# Patient Record
Sex: Female | Born: 1975 | Race: White | Hispanic: No | Marital: Single | Smoking: Current every day smoker
Health system: Southern US, Community
[De-identification: ages and names within clinical notes are randomized; demographics above are authoritative.]

## PROBLEM LIST (undated history)

## (undated) DIAGNOSIS — D649 Anemia, unspecified: Secondary | ICD-10-CM

## (undated) DIAGNOSIS — K5792 Diverticulitis of intestine, part unspecified, without perforation or abscess without bleeding: Secondary | ICD-10-CM

## (undated) HISTORY — PX: CHOLECYSTECTOMY: SHX55

---

## 2002-10-27 ENCOUNTER — Encounter (HOSPITAL_BASED_OUTPATIENT_CLINIC_OR_DEPARTMENT_OTHER): Payer: Self-pay | Admitting: Family

## 2005-11-27 DIAGNOSIS — K5792 Diverticulitis of intestine, part unspecified, without perforation or abscess without bleeding: Secondary | ICD-10-CM

## 2005-11-27 HISTORY — DX: Diverticulitis of intestine, part unspecified, without perforation or abscess without bleeding: K57.92

## 2011-09-18 HISTORY — PX: BACK SURGERY: SHX140

## 2012-09-10 ENCOUNTER — Emergency Department (HOSPITAL_COMMUNITY)
Admission: EM | Admit: 2012-09-10 | Discharge: 2012-09-10 | Disposition: A | Discharge status: Home / Self Care | Payer: Self-pay | Attending: Emergency Medicine | Admitting: Emergency Medicine

## 2012-09-10 ENCOUNTER — Encounter (HOSPITAL_COMMUNITY): Payer: Self-pay | Admitting: Emergency Medicine

## 2012-09-10 ENCOUNTER — Emergency Department (HOSPITAL_COMMUNITY): Payer: Self-pay

## 2012-09-10 DIAGNOSIS — K5792 Diverticulitis of intestine, part unspecified, without perforation or abscess without bleeding: Secondary | ICD-10-CM

## 2012-09-10 DIAGNOSIS — K5732 Diverticulitis of large intestine without perforation or abscess without bleeding: Secondary | ICD-10-CM | POA: Insufficient documentation

## 2012-09-10 DIAGNOSIS — R1032 Left lower quadrant pain: Secondary | ICD-10-CM | POA: Insufficient documentation

## 2012-09-10 HISTORY — DX: Diverticulitis of intestine, part unspecified, without perforation or abscess without bleeding: K57.92

## 2012-09-10 LAB — CBC WITH DIFFERENTIAL/PLATELET
Basophils Absolute: 0 10*3/uL (ref 0.0–0.1)
Basophils Relative: 1 % (ref 0–1)
Eosinophils Absolute: 0.3 10*3/uL (ref 0.0–0.7)
Lymphs Abs: 2.5 10*3/uL (ref 0.7–4.0)
MCH: 28.4 pg (ref 26.0–34.0)
MCHC: 33.4 g/dL (ref 30.0–36.0)
Neutrophils Relative %: 61 % (ref 43–77)
Platelets: 195 10*3/uL (ref 150–400)
RBC: 4.29 MIL/uL (ref 3.87–5.11)
RDW: 12.5 % (ref 11.5–15.5)

## 2012-09-10 LAB — URINALYSIS, ROUTINE W REFLEX MICROSCOPIC
Bilirubin Urine: NEGATIVE
Nitrite: NEGATIVE
Protein, ur: NEGATIVE mg/dL
Specific Gravity, Urine: 1.011 (ref 1.005–1.030)
Urobilinogen, UA: 0.2 mg/dL (ref 0.0–1.0)

## 2012-09-10 LAB — BASIC METABOLIC PANEL
Calcium: 8.9 mg/dL (ref 8.4–10.5)
GFR calc Af Amer: >90 mL/min (ref >90)
GFR calc non Af Amer: >90 mL/min (ref >90)
Potassium: 4.1 mEq/L (ref 3.5–5.1)
Sodium: 139 mEq/L (ref 135–145)

## 2012-09-10 LAB — PREGNANCY, URINE: Preg Test, Ur: NEGATIVE

## 2012-09-10 MED ORDER — OXYCODONE-ACETAMINOPHEN 5-325 MG PO TABS: 1.0000 | ORAL_TABLET | Freq: Four times a day (QID) | ORAL | Status: DC | PRN

## 2012-09-10 MED ORDER — IOHEXOL 300 MG/ML  SOLN
100.0000 mL | Freq: Once | INTRAMUSCULAR | Status: AC | PRN
  Administered 2012-09-10: 100 mL via INTRAVENOUS

## 2012-09-10 MED ORDER — METRONIDAZOLE 500 MG PO TABS: 500.0000 mg | ORAL_TABLET | Freq: Two times a day (BID) | ORAL | Status: DC

## 2012-09-10 MED ORDER — SODIUM CHLORIDE 0.9 % IV BOLUS (SEPSIS)
1000.0000 mL | Freq: Once | INTRAVENOUS | Status: AC
  Administered 2012-09-10: 1000 mL via INTRAVENOUS

## 2012-09-10 MED ORDER — FENTANYL CITRATE 0.05 MG/ML IJ SOLN
50.0000 ug | Freq: Once | INTRAMUSCULAR | Status: AC
  Administered 2012-09-10: 50 ug via INTRAVENOUS
  Filled 2012-09-10: qty 2

## 2012-09-10 MED ORDER — CIPROFLOXACIN HCL 500 MG PO TABS: 500.0000 mg | ORAL_TABLET | Freq: Two times a day (BID) | ORAL | Status: DC

## 2012-09-10 MED ORDER — ONDANSETRON HCL 4 MG PO TABS: 4.0000 mg | ORAL_TABLET | Freq: Four times a day (QID) | ORAL | Status: DC

## 2012-09-10 MED ORDER — ONDANSETRON HCL 4 MG/2ML IJ SOLN
4.0000 mg | Freq: Once | INTRAMUSCULAR | Status: AC
  Administered 2012-09-10: 4 mg via INTRAVENOUS
  Filled 2012-09-10: qty 2

## 2012-09-10 NOTE — ED Provider Notes (Signed)
History     CSN: [id number]  Arrival date & time 09/10/12  1053   First MD Initiated Contact with Patient 09/10/12 1113      Chief Complaint  Patient presents with  . Abdominal Pain    (Consider location/radiation/quality/duration/timing/severity/associated sxs/prior treatment) HPI  Patient presents to the emergency department with 3 days of left lower quadrant abdominal pain that has been persistently getting worse. She says that she has had associated hot flashes but has not taken her temperature. She does endorse having urinary frequency when she coughs but denies any dysuria or hematuria. She states that she just finished her period and is not having any abnormal vaginal discharge. Her pain gets worse she takes large breath or bends over. Her last regular bowel movement was this morning. She has had only one episode of vomiting which was this morning after walking a long distance but has not had any diarrhea. She states that this happened once in 2007 and she was diagnosed with "some sort of inflammation in her colon". NAD/ VSS Past Medical History  Diagnosis Date  . Diverticulitis 2007    Past Surgical History  Procedure Date  . Cholecystectomy   . Cesarean section 2008    History reviewed. No pertinent family history.  History  Substance Use Topics  . Smoking status: Current Every Day Smoker -- 0.5 packs/day    Types: Cigarettes  . Smokeless tobacco: Not on file  . Alcohol Use: Yes     rarely    OB History    Grav Para Term Preterm Abortions TAB SAB Ect Mult Living                  Review of Systems  Review of Systems  Gen: no weight loss, fevers, chills, night sweats  Eyes: no discharge or drainage, no occular pain or visual changes  Nose: no epistaxis or rhinorrhea  Mouth: no dental pain, no sore throat  Neck: no neck pain  Lungs:No wheezing, coughing or hemoptysis CV: no chest pain, palpitations, dependent edema or orthopnea  Abd: + abdominal pain,  nausea, and 1 episode of vomiting  GU: no dysuria or gross hematuria  MSK:  No abnormalities  Neuro: no headache, no focal neurologic deficits  Skin: no abnormalities Psyche: negative.    Allergies  Review of patient's allergies indicates no known allergies.  Home Medications   Current Outpatient Rx  Name Route Sig Dispense Refill  . ACETAMINOPHEN 325 MG PO TABS Oral Take 650 mg by mouth every 6 (six) hours as needed. pain    . IBUPROFEN 200 MG PO TABS Oral Take 600 mg by mouth every 6 (six) hours as needed. pain      BP 104/55  Pulse 64  Temp 97.9 F (36.6 C) (Oral)  Resp 16  Ht 5\' 7"  (1.702 m)  Wt 180 lb (81.647 kg)  BMI 28.19 kg/m2  SpO2 100%  LMP 09/04/2012  Physical Exam  Nursing note and vitals reviewed. Constitutional: She appears well-developed and well-nourished. No distress.  HENT:  Head: Normocephalic and atraumatic.  Eyes: Pupils are equal, round, and reactive to light.  Neck: Normal range of motion. Neck supple.  Cardiovascular: Normal rate and regular rhythm.   Pulmonary/Chest: Effort normal.  Abdominal: Soft. She exhibits no distension and no mass. There is tenderness (LLQ). There is no rebound and no guarding.  Neurological: She is alert.  Skin: Skin is warm and dry.    ED Course  Procedures (including critical care time)  Labs Reviewed  PREGNANCY, URINE  URINALYSIS, ROUTINE W REFLEX MICROSCOPIC  CBC WITH DIFFERENTIAL  BASIC METABOLIC PANEL   Ct Abdomen Pelvis W Contrast  09/10/2012  *RADIOLOGY REPORT*  Clinical Data: Left lower quadrant pain for 3 days.  Enhancement fever.  CT ABDOMEN AND PELVIS WITH CONTRAST  Technique:  Multidetector CT imaging of the abdomen and pelvis was performed following the standard protocol during bolus administration of intravenous contrast.  Contrast: OMNIPAQUE IOHEXOL 300 MG/ML  SOLN  Comparison: None.  Findings: Scattered diverticula throughout the colon.  Pericolonic inflammation surrounds the proximal  descending colon suggestive of diverticulitis.  No focal worrisome hepatic, splenic, pancreatic, adrenal or renal lesion.  No abdominal aortic aneurysm.  Retroaortic left renal vein incidentally noted.  Urinary bladder is unremarkable.  Adnexal structures without worrisome abnormality.  Minimal atelectatic changes lung bases.  Mild degenerative changes T11-12.  No worrisome bony destructive lesion.  IMPRESSION: Scattered diverticula throughout the colon.  Pericolonic inflammation surrounds the proximal descending colon suggestive of diverticulitis.  Results called to Marlon Pel physician's assistant 10/15 08/16/2012 4:35 p.m.   Original Report Authenticated By: Fuller Canada, M.D.      No diagnosis found. Dx: diverticulitis    MDM  Pt not having any systemic symptoms and vital signs are stable. She has had diverticulitis in the past and did well on outpatient basis.  Will give referral to GI doctor as well as Cipro and Flagyl, with percocet and Zofran.  Pt has been advised of the symptoms that warrant their return to the ED. Patient has voiced understanding and has agreed to follow-up with the PCP or specialist.         Dorthula Matas, PA 09/10/12 1654

## 2012-09-10 NOTE — ED Notes (Signed)
Pt c/o LLQ abdominal pain x 2-3 days w/ nausea and "intermittant fevers and hot flashes". Pt also reports urinary frequency but denies and burning w/ urination or vaginal discharge. Pt states abd pain is worse w/ inspiration and bending over. Pt aslo states she has been constipated for 3 days but did have bowel movement today.

## 2012-09-10 NOTE — ED Notes (Signed)
Notified CT that pt has finished oral contrast.

## 2012-09-11 NOTE — ED Provider Notes (Signed)
Medical screening examination/treatment/procedure(s) were performed by non-physician practitioner and as supervising physician I was immediately available for consultation/collaboration.   Charles B. Sheldon, MD 09/11/12 1317 

## 2013-08-01 ENCOUNTER — Emergency Department: Payer: Self-pay | Admitting: Psychiatry

## 2013-08-01 LAB — URINALYSIS, COMPLETE
Nitrite: NEGATIVE
RBC,UR: 1 /HPF (ref 0–5)
Specific Gravity: 1.016 (ref 1.003–1.030)

## 2013-08-01 LAB — GC/CHLAMYDIA PROBE AMP

## 2013-12-17 IMAGING — CT CT ABD-PELV W/ CM
1 of 2 series · 15 of 32 positions shown, 19 images · IV contrast (OMNIPAQUE 300)
Comparison: None.

CLINICAL DATA: Left lower quadrant pain for 3 days.  Enhancement
fever.

CT ABDOMEN AND PELVIS WITH CONTRAST
TECHNIQUE: Multidetector CT imaging of the abdomen and pelvis was
performed following the standard protocol during bolus
administration of intravenous contrast.
Contrast: 100mL OMNIPAQUE IOHEXOL 300 MG/ML  SOLN

[Series 2: abd/pel with · axial · 0.74mm/px · z∈[-306,+84]mm · 15 of 86 slices shown, 19 images]
[im 4/86  soft-tissue]
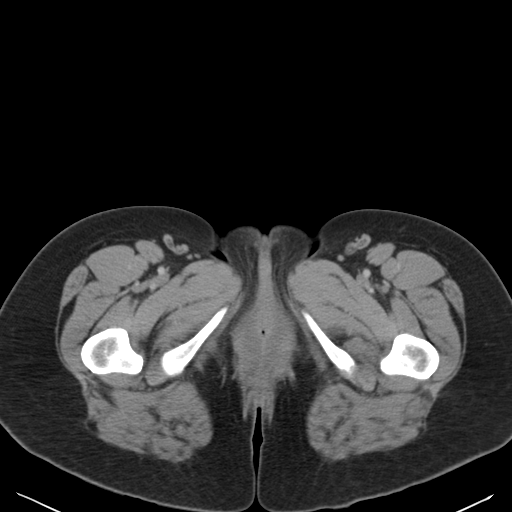
[im 4/86  bone]
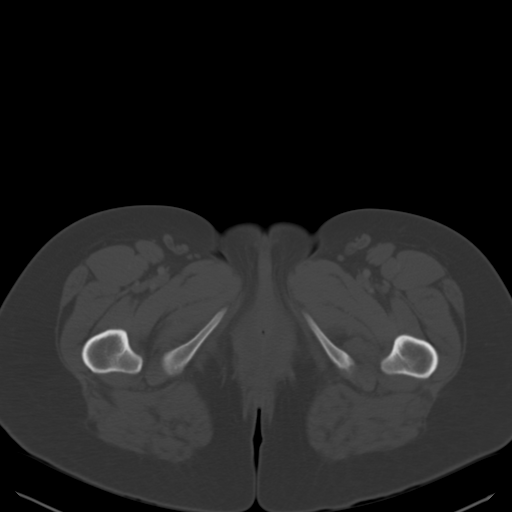
[im 11/86  soft-tissue]
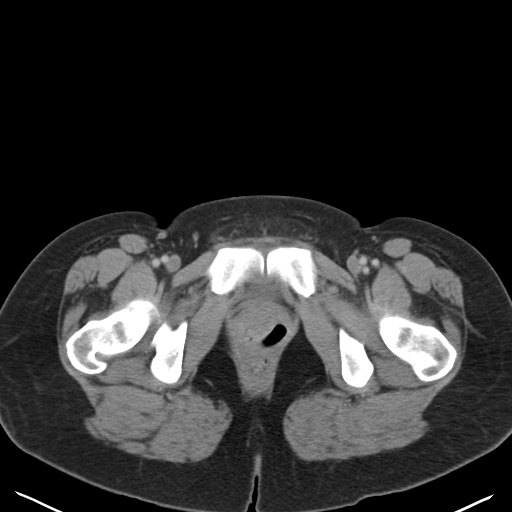
[im 18/86  soft-tissue]
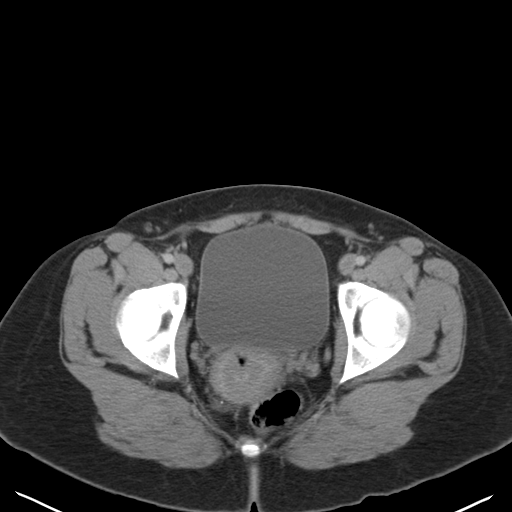
[im 25/86  soft-tissue]
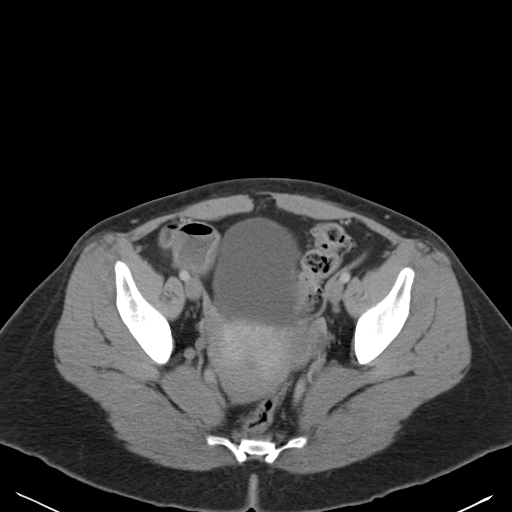
[im 29/86  soft-tissue]
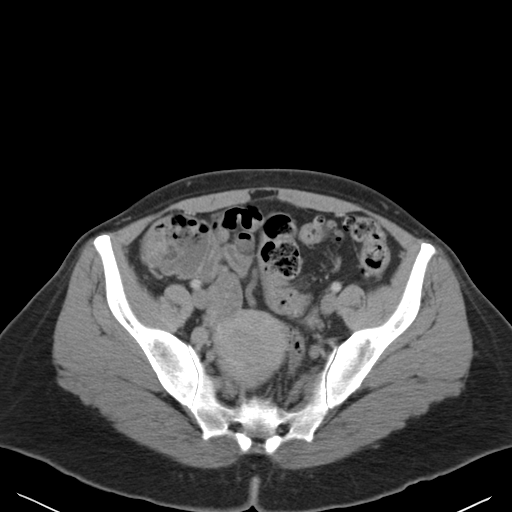
[im 36/86  soft-tissue]
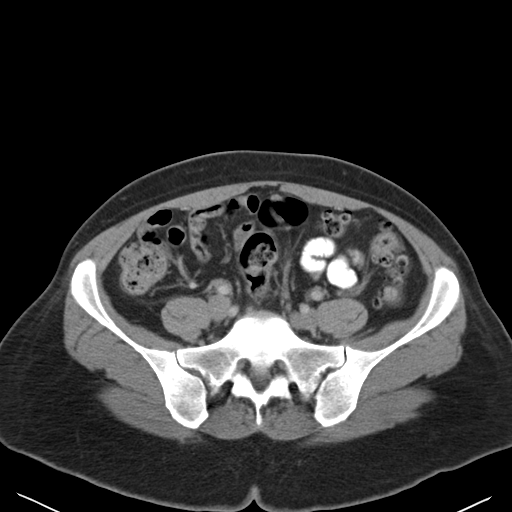
[im 43/86  soft-tissue]
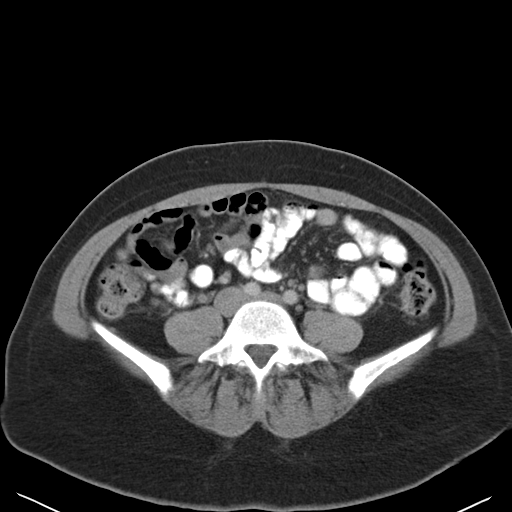
[im 50/86  soft-tissue]
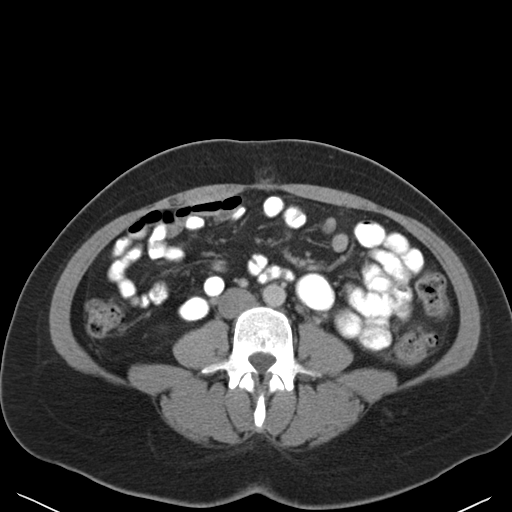
[im 57/86  soft-tissue]
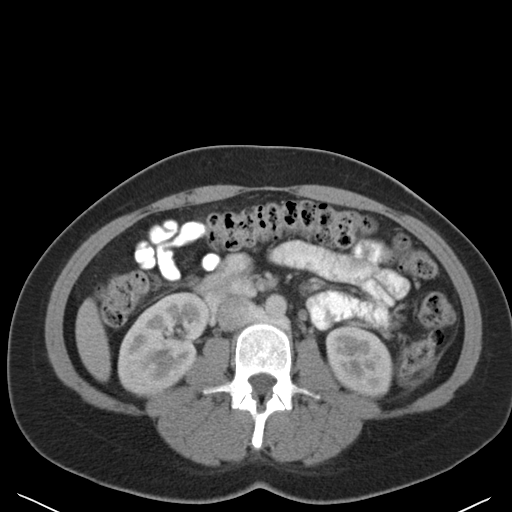
[im 57/86  bone]
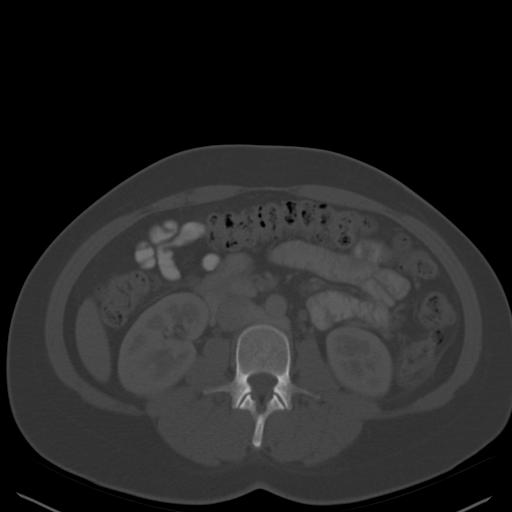
[im 61/86  soft-tissue]
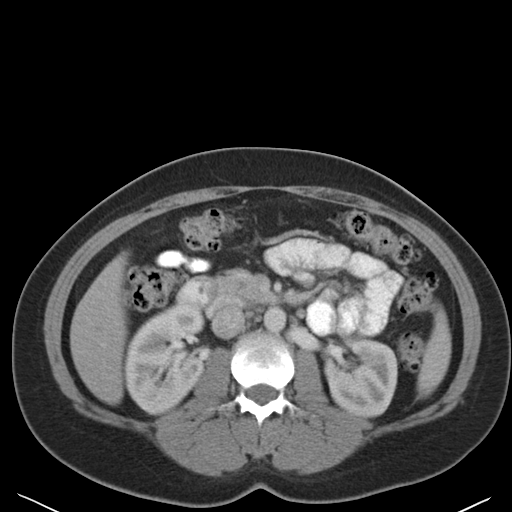
[im 68/86  soft-tissue]
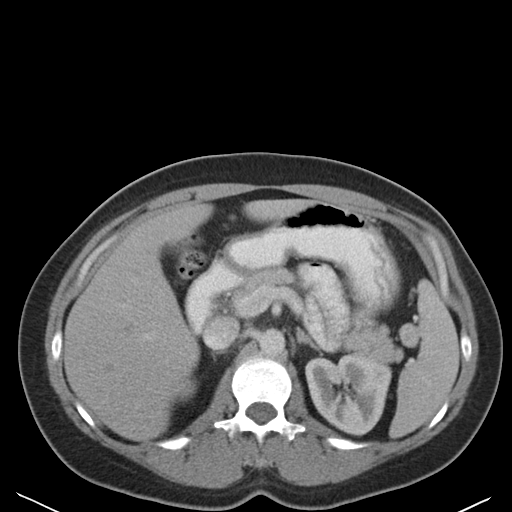
[im 71/86  lung]
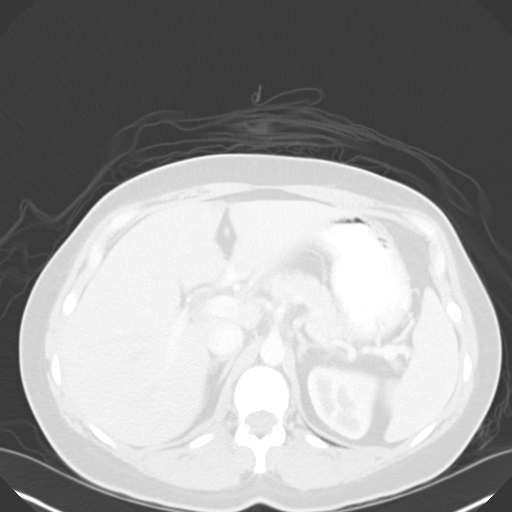
[im 75/86  soft-tissue]
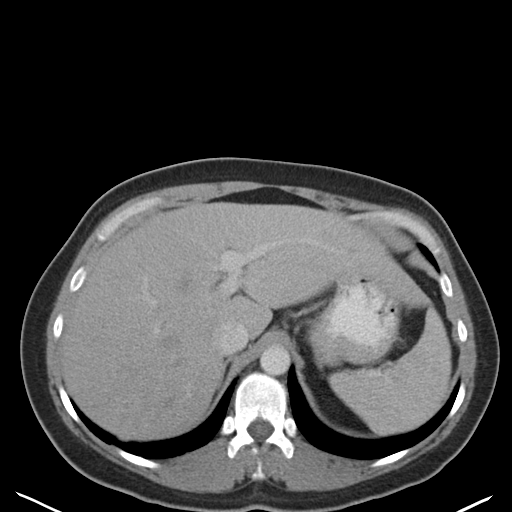
[im 75/86  lung]
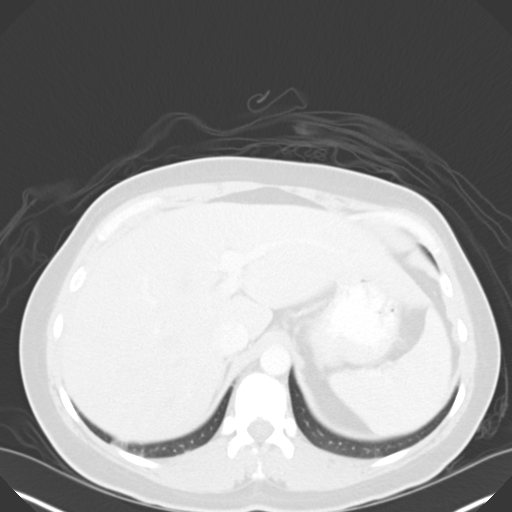
[im 78/86  lung]
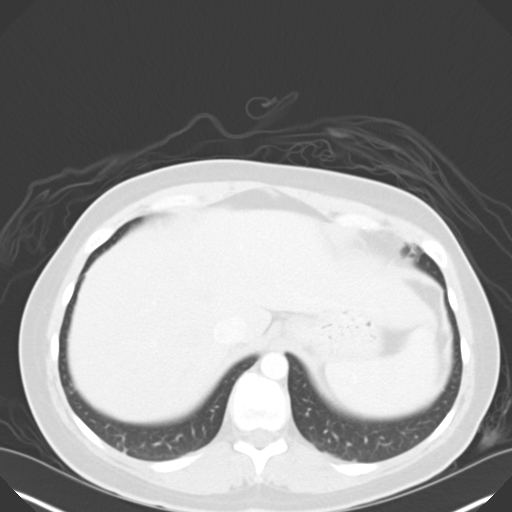
[im 82/86  soft-tissue]
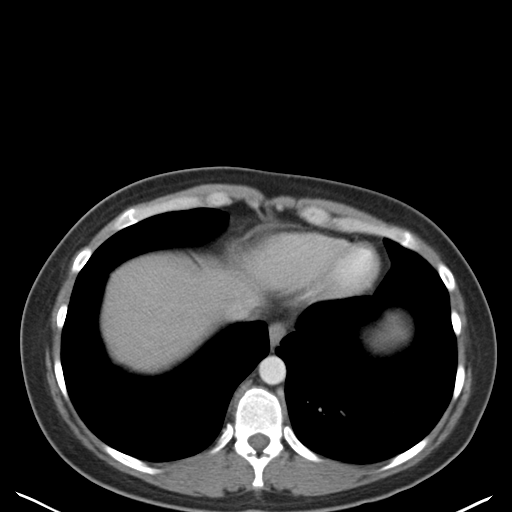
[im 82/86  lung]
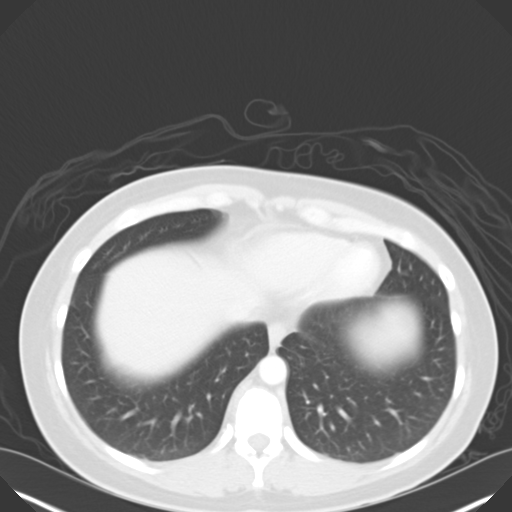

[15 of 32 positions shown; findings below may reference images not displayed]

FINDINGS: Scattered diverticula throughout the colon.  Pericolonic
inflammation surrounds the proximal descending colon suggestive of
diverticulitis.

No focal worrisome hepatic, splenic, pancreatic, adrenal or renal
lesion.

No abdominal aortic aneurysm.  Retroaortic left renal vein
incidentally noted.

Urinary bladder is unremarkable.  Adnexal structures without
worrisome abnormality.

Minimal atelectatic changes lung bases.

Mild degenerative changes T11-12.  No worrisome bony destructive
lesion.
IMPRESSION: Scattered diverticula throughout the colon.  Pericolonic
inflammation surrounds the proximal descending colon suggestive of
diverticulitis.

Results called to Chai Tiger physician's assistant [DATE]
08/16/2012 [DATE] p.m..

## 2014-08-27 ENCOUNTER — Emergency Department: Payer: Self-pay | Admitting: Emergency Medicine

## 2014-08-27 LAB — COMPREHENSIVE METABOLIC PANEL
ALBUMIN: 3.3 g/dL — AB (ref 3.4–5.0)
ALK PHOS: 78 U/L
ANION GAP: 4 — AB (ref 7–16)
BUN: 4 mg/dL — AB (ref 7–18)
Bilirubin,Total: 0.4 mg/dL (ref 0.2–1.0)
CALCIUM: 8.1 mg/dL — AB (ref 8.5–10.1)
CO2: 26 mmol/L (ref 21–32)
Chloride: 108 mmol/L — ABNORMAL HIGH (ref 98–107)
Creatinine: 0.5 mg/dL — ABNORMAL LOW (ref 0.60–1.30)
GLUCOSE: 83 mg/dL (ref 65–99)
OSMOLALITY: 272 (ref 275–301)
Potassium: 3.8 mmol/L (ref 3.5–5.1)
SGOT(AST): 38 U/L — ABNORMAL HIGH (ref 15–37)
SGPT (ALT): 59 U/L
Sodium: 138 mmol/L (ref 136–145)
TOTAL PROTEIN: 6.6 g/dL (ref 6.4–8.2)

## 2014-08-27 LAB — URINALYSIS, COMPLETE
BILIRUBIN, UR: NEGATIVE
Blood: NEGATIVE
GLUCOSE, UR: NEGATIVE mg/dL (ref 0–75)
Ketone: NEGATIVE
Leukocyte Esterase: NEGATIVE
NITRITE: NEGATIVE
PH: 6 (ref 4.5–8.0)
Protein: NEGATIVE
RBC,UR: 1 /HPF (ref 0–5)
SPECIFIC GRAVITY: 1.016 (ref 1.003–1.030)
WBC UR: 2 /HPF (ref 0–5)

## 2014-08-27 LAB — CBC
HCT: 40.8 % (ref 35.0–47.0)
HGB: 13.3 g/dL (ref 12.0–16.0)
MCH: 28.7 pg (ref 26.0–34.0)
MCHC: 32.7 g/dL (ref 32.0–36.0)
MCV: 88 fL (ref 80–100)
Platelet: 163 10*3/uL (ref 150–440)
RBC: 4.65 10*6/uL (ref 3.80–5.20)
RDW: 13.1 % (ref 11.5–14.5)
WBC: 7.6 10*3/uL (ref 3.6–11.0)

## 2014-08-27 LAB — HCG, QUANTITATIVE, PREGNANCY: BETA HCG, QUANT.: 31921 m[IU]/mL — AB

## 2014-09-23 LAB — OB RESULTS CONSOLE RUBELLA ANTIBODY, IGM: Rubella: IMMUNE

## 2014-09-23 LAB — OB RESULTS CONSOLE HEPATITIS B SURFACE ANTIGEN: Hepatitis B Surface Ag: NEGATIVE

## 2014-09-23 LAB — OB RESULTS CONSOLE HIV ANTIBODY (ROUTINE TESTING): HIV: NONREACTIVE

## 2014-09-23 LAB — OB RESULTS CONSOLE ABO/RH: RH TYPE: NEGATIVE

## 2014-09-23 LAB — OB RESULTS CONSOLE VARICELLA ZOSTER ANTIBODY, IGG: Varicella: IMMUNE

## 2014-09-23 LAB — OB RESULTS CONSOLE RPR: RPR: NONREACTIVE

## 2014-09-23 LAB — OB RESULTS CONSOLE ANTIBODY SCREEN: Antibody Screen: POSITIVE

## 2015-01-12 LAB — OB RESULTS CONSOLE RPR: RPR: NONREACTIVE

## 2015-01-12 LAB — OB RESULTS CONSOLE HIV ANTIBODY (ROUTINE TESTING): HIV: NONREACTIVE

## 2015-01-12 LAB — OB RESULTS CONSOLE GC/CHLAMYDIA
CHLAMYDIA, DNA PROBE: NEGATIVE
GC PROBE AMP, GENITAL: NEGATIVE

## 2015-04-05 ENCOUNTER — Encounter
Admission: RE | Admit: 2015-04-05 | Discharge: 2015-04-05 | Disposition: A | Discharge status: Home / Self Care | Payer: Medicaid Other | Source: Ambulatory Visit | Attending: Obstetrics and Gynecology | Admitting: Obstetrics and Gynecology

## 2015-04-05 HISTORY — DX: Anemia, unspecified: D64.9

## 2015-04-05 LAB — CBC
HEMATOCRIT: 35.8 % (ref 35.0–47.0)
HEMOGLOBIN: 12.1 g/dL (ref 12.0–16.0)
MCH: 30 pg (ref 26.0–34.0)
MCHC: 33.7 g/dL (ref 32.0–36.0)
MCV: 88.9 fL (ref 80.0–100.0)
Platelets: 188 10*3/uL (ref 150–440)
RBC: 4.02 MIL/uL (ref 3.80–5.20)
RDW: 14.3 % (ref 11.5–14.5)
WBC: 10.7 10*3/uL (ref 3.6–11.0)

## 2015-04-05 LAB — DIFFERENTIAL
BASOS ABS: 0.1 10*3/uL (ref 0–0.1)
BASOS PCT: 1 %
EOS PCT: 2 %
Eosinophils Absolute: 0.3 10*3/uL (ref 0–0.7)
LYMPHS PCT: 18 %
Lymphs Abs: 1.9 10*3/uL (ref 1.0–3.6)
MONO ABS: 0.8 10*3/uL (ref 0.2–0.9)
Monocytes Relative: 7 %
NEUTROS ABS: 7.7 10*3/uL — AB (ref 1.4–6.5)
Neutrophils Relative %: 72 %

## 2015-04-05 LAB — TYPE AND SCREEN
ABO/RH(D): O NEG
Antibody Screen: NEGATIVE

## 2015-04-05 LAB — ABO/RH: ABO/RH(D): O NEG

## 2015-04-05 NOTE — Discharge Instructions (Signed)
Cesarean Delivery  Cesarean delivery is the birth of a baby through a cut (incision) in the abdomen and womb (uterus).  LET [facility] PROVID[facility] KNOW ABOUT:  All medicines you are taking, including vitamins, herbs, eye drops, creams, and over-the-counter medicines.  Previous problems you or members of your family have had with the use of anesthetics.  Any blood disorders you have.  Previous surgeries you have had.  Medical conditions you have.  Any allergies you have.  Complicationsinvolving the pregnancy. RISKS AND COMPLICATIONS  Generally, this is a safe procedure. However, as with any procedure, complications can occur. Possible complications include:  Bleeding.  Infection.  Blood clots.  Injury to surrounding organs.  Problems with anesthesia.  Injury to the baby. BEFORE THE PROCEDURE   You may be given an antacid medicine to drink. This will prevent acid contents in your stomach from going into your lungs if you vomit during the surgery.  You may be given an antibiotic medicine to prevent infection. PROCEDURE   Hair may be removed from your pubic area and your lower abdomen. This is to prevent infection in the incision site.  A tube (Foley catheter) will be placed in your bladder to drain your urine from your bladder into a bag. This keeps your bladder empty during surgery.  An IV tube will be placed in your vein.  You may be given medicine to numb the lower half of your body (regional anesthetic). If you were in labor, you may have already had an epidural in place which can be used in both labor and cesarean delivery. You may possibly be given medicine to make you sleep (general anesthetic) though this is not as common.  An incision will be made in your abdomen that extends to your uterus. There are 2 basic kinds of incisions:  The horizontal (transverse) incision. Horizontal incisions are from side to side and are used for most routine cesarean  deliveries.  The vertical incision. The vertical incision is from the top of the abdomen to the bottom and is less commonly used. It is often done for women who have a serious complication (extreme prematurity) or under emergency situations.  The horizontal and vertical incisions may both be used at the same time. However, this is very uncommon.  An incision is then made in your uterus to deliver the baby.  Your baby will then be delivered.  Both incisions are then closed with absorbable stitches. AFTER THE PROCEDURE   If you were awake during the surgery, you will see your baby right away. If you were asleep, you will see your baby as soon as you are awake.  You may breastfeed your baby after surgery.  You may be able to get up and walk the same day as the surgery. If you need to stay in bed for a period of time, you will receive help to turn, cough, and take deep breaths after surgery. This helps prevent lung problems such as pneumonia.  Do not get out of bed alone the first time after surgery. You will need help getting out of bed until you are able to do this by yourself.  You may be able to shower the day after your cesarean delivery. After the bandage (dressing) is taken off the incision site, a nurse will assist you to shower if you would like help.  You will have pneumatic compression hose placed on your lower legs. This is done to prevent blood clots. When you are up  and walking regularly, they will no longer be necessary.  Do not cross your legs when you sit.  Save any blood clots that you pass. If you pass a clot while on the toilet, do not flush it. Call for the nurse. Tell the nurse if you think you are bleeding too much or passing too many clots.  You will be given medicine as needed. Let your health care providers know if you are hurting. You may also be given an antibiotic to prevent an infection.  Your IV tube will be taken out when you are drinking a reasonable  amount of fluids. The Foley catheter is taken out when you are up and walking.  If your blood type is Rh negative and your baby's blood type is Rh positive, you will be given a shot of anti-D immune globulin. This shot prevents you from having Rh problems with a future pregnancy. You should get the shot even if you had your tubes tied (tubal ligation).  If you are allowed to take the baby for a walk, place the baby in the bassinet and push it. Do not carry your baby in your arms. Document Released: 11/13/2005 Document Revised: 09/03/2013 Document Reviewed: 06/04/2013 [facility] Patient Information 2015 [place], [place]. This information is not intended to replace advice given to you by your health care provider. Make sure you discuss any questions you have with your health care provider. Incentive Spirometer An incentive spirometer is a tool that can help keep your lungs clear and active. This tool measures how well you are filling your lungs with each breath. Taking long, deep breaths may help reverse or decrease the chance of developing breathing (pulmonary) problems (especially infection) following:  Surgery of the chest or abdomen.  Surgery if you have a history of smoking or a lung problem.  A long period of time when you are unable to move or be active. BEFORE THE PROCEDURE   If the spirometer includes an indicator to show your best effort, your nurse or respiratory therapist will set it to a desired goal.  If possible, sit up straight or lean slightly forward. Try not to slouch.  Hold the incentive spirometer in an upright position. INSTRUCTIONS FOR USE   Sit on the edge of your bed if possible, or sit up as far as you can in bed or on a chair.  Hold the incentive spirometer in an upright position.  Breathe out normally.  Place the mouthpiece in your mouth and seal your lips tightly around it.  Breathe in slowly and as deeply as possible, raising the piston or the ball toward the  top of the column.  Hold your breath for 3-5 seconds or for as long as possible. Allow the piston or ball to fall to the bottom of the column.  Remove the mouthpiece from your mouth and breathe out normally.  Rest for a few seconds and repeat Steps 1 through 7 at least 10 times every 1-2 hours when you are awake. Take your time and take a few normal breaths between deep breaths.  The spirometer may include an indicator to show your best effort. Use the indicator as a goal to work toward during each repetition.  After each set of 10 deep breaths, practice coughing to be sure your lungs are clear. If you have an incision (the cut made at the time of surgery), support your incision when coughing by placing a pillow or rolled-up towels firmly against it. Once you are able  to get out of bed, walk around indoors and cough well. You may stop using the incentive spirometer when instructed by your caregiver.  RISKS AND COMPLICATIONS  Breathing too quickly may cause dizziness. At an extreme, this could cause you to pass out. Take your time so you do not get dizzy or light-headed.  If you are in pain, you may need to take or ask for pain medication before doing incentive spirometry. It is harder to take a deep breath if you are having pain. AFTER USE  Rest and breathe slowly and easily.  It can be helpful to keep a log of your progress. Your caregiver can provide you with a simple table to help with this. If you are using the spirometer at home, follow these instructions: SEEK MEDICAL CARE IF:   You are having difficultly using the spirometer.  You have trouble using the spirometer as often as instructed.  Your pain medication is not giving enough relief while using the spirometer.  You develop fever of 100.76F (38.1C) or higher. SEEK IMMEDIATE MEDICAL CARE IF:   You cough up bloody sputum that had not been present before.  You develop fever of 102F (38.9C) or greater.  You develop  worsening pain at or near the incision site. MAKE SURE YOU:   Understand these instructions.  Will watch your condition.  Will get help right away if you are not doing well or get worse. Document Released: 03/26/2007 Document Revised: 03/30/2014 Document Reviewed: 05/27/2007 [facility] Patient Information 2015 [place]. This information is not intended to replace advice given to you by your health care provider. Make sure you discuss any questions you have with your health care provider. Surgical Site Infections FAQs What is a Surgical Site Infection (SSI)? A surgical site infection is an infection that occurs after surgery in the part of the body where the surgery took place. Most patients who have surgery do not develop an infection. However, infections develop in about 1 to 3 out of every 100 patients who have surgery. Some of the common symptoms of a surgical site infection are:  Redness and pain around the area where you had surgery  Drainage of cloudy fluid from your surgical wound  Fever Can SSIs be treated? Yes. Most surgical site infections can be treated with antibiotics. The antibiotic given to you depends on the bacteria (germs) causing the infections. Sometimes patients with SSIs also need another surgery to treat the infection. What are some of the things that hospitals are doing to prevent SSIs? To prevent SSIs, doctors, nurses, and other healthcare providers:  Clean their hands and arms up to their elbows with an antiseptic agent just before the surgery.  Clean their hands with soap and water or an alcohol-based hand rub before and after caring for each patient.  May remove some of your hair immediately before your surgery using electric clippers if the hair is in the same area where the procedure will occur. They should not shave you with a razor.  Wear special hair covers, masks, gowns, and gloves during surgery to keep the surgery area clean.  Give you  antibiotics before your surgery starts. In most cases, you should get antibiotics within 60 minutes before the surgery starts and the antibiotics should be stopped within 24 hours after surgery.  Clean the skin at the site of your surgery with a special soap that kills germs. What can I do to help prevent SSIs? Before your surgery:  Tell your doctor about  other medical problems you may have. Health problems such as allergies, diabetes, and obesity could affect your surgery and your treatment.  Quit smoking. Patients who smoke get more infections. Talk to your doctor about how you can quit before your surgery.  Do not shave near where you will have surgery. Shaving with a razor can irritate your skin and make it easier to develop an infection. At the time of your surgery:  Speak up if someone tries to shave you with a razor before surgery. Ask why you need to be shaved and talk with your surgeon if you have any concerns.  Ask if you will get antibiotics before surgery. After your surgery:  Make sure that your healthcare providers clean their hands before examining you, either with soap and water or an alcohol-based hand rub.  If you do not see your providers clean their hands, please ask them to do so.  Family and friends who visit you should not touch the surgical wound or dressings.  Family and friends should clean their hands with soap and water or an alcohol-based hand rub before and after visiting you. If you do not see them clean their hands, ask them to clean their hands. What do I need to do when I go home from the hospital?  Before you go home, your doctor or nurse should explain everything you need to know about taking care of your wound. Make sure you understand how to care for your wound before you leave the hospital.  Always clean your hands before and after caring for your wound.  Before you go home, make sure you know who to contact if you have questions or problems after  you get home.  If you have any symptoms of an infection, such as redness and pain at the surgery site, drainage, or fever, call your doctor immediately. If you have additional questions, please ask your doctor or nurse. Developed and co-sponsored by [employer] for [employer] of [place] [phone]); Infectious Diseases Society of America (IDSA); [facility] Association; Association for Professionals in Infection Control and Epidemiology (APIC); Centers for Disease Control and Prevention (CDC); and [employer] Document Released: 11/18/2013 Document Reviewed: 11/18/2013 [facility] Patient Information 2015 [place], [place]. This information is not intended to replace advice given to you by your health care provider. Make sure you discuss any questions you have with your health care provider.

## 2015-04-06 ENCOUNTER — Encounter
Admission: RE | Disposition: A | Discharge status: Home / Self Care | Payer: Self-pay | Source: Ambulatory Visit | Attending: Obstetrics and Gynecology

## 2015-04-06 ENCOUNTER — Inpatient Hospital Stay
Admission: RE | Admit: 2015-04-06 | Discharge: 2015-04-08 | DRG: 766 | Disposition: A | Discharge status: Home / Self Care | Payer: Medicaid Other | Source: Ambulatory Visit | Attending: Obstetrics and Gynecology | Admitting: Obstetrics and Gynecology

## 2015-04-06 ENCOUNTER — Inpatient Hospital Stay: Payer: Medicaid Other | Admitting: Anesthesiology

## 2015-04-06 DIAGNOSIS — D649 Anemia, unspecified: Secondary | ICD-10-CM | POA: Diagnosis present

## 2015-04-06 DIAGNOSIS — O9902 Anemia complicating childbirth: Secondary | ICD-10-CM | POA: Diagnosis present

## 2015-04-06 DIAGNOSIS — O403XX0 Polyhydramnios, third trimester, not applicable or unspecified: Principal | ICD-10-CM | POA: Diagnosis present

## 2015-04-06 DIAGNOSIS — O3421 Maternal care for scar from previous cesarean delivery: Secondary | ICD-10-CM | POA: Diagnosis present

## 2015-04-06 DIAGNOSIS — Z3A39 39 weeks gestation of pregnancy: Secondary | ICD-10-CM

## 2015-04-06 DIAGNOSIS — Z98891 History of uterine scar from previous surgery: Secondary | ICD-10-CM

## 2015-04-06 DIAGNOSIS — O99334 Smoking (tobacco) complicating childbirth: Secondary | ICD-10-CM | POA: Diagnosis present

## 2015-04-06 DIAGNOSIS — O09523 Supervision of elderly multigravida, third trimester: Secondary | ICD-10-CM

## 2015-04-06 LAB — HIV ANTIBODY (ROUTINE TESTING W REFLEX): HIV SCREEN 4TH GENERATION: NONREACTIVE

## 2015-04-06 LAB — RPR: RPR Ser Ql: NONREACTIVE

## 2015-04-06 SURGERY — Surgical Case
Anesthesia: Spinal

## 2015-04-06 MED ORDER — NALOXONE HCL 0.4 MG/ML IJ SOLN
0.4000 mg | INTRAMUSCULAR | Status: DC | PRN
  Filled 2015-04-06: qty 1

## 2015-04-06 MED ORDER — SODIUM CHLORIDE 0.9 % IJ SOLN: 3.0000 mL | INTRAMUSCULAR | Status: DC | PRN

## 2015-04-06 MED ORDER — LACTATED RINGERS IV BOLUS (SEPSIS)
999.0000 mL | Freq: Once | INTRAVENOUS | Status: AC
  Administered 2015-04-06: 999 mL via INTRAVENOUS

## 2015-04-06 MED ORDER — CITRIC ACID-SODIUM CITRATE 334-500 MG/5ML PO SOLN
30.0000 mL | Freq: Once | ORAL | Status: AC
  Administered 2015-04-06: 30 mL via ORAL
  Filled 2015-04-06: qty 30

## 2015-04-06 MED ORDER — OXYTOCIN 40 UNITS IN LACTATED RINGERS INFUSION - SIMPLE MED
INTRAVENOUS | Status: DC | PRN
  Administered 2015-04-06: 1000 mL via INTRAVENOUS

## 2015-04-06 MED ORDER — IBUPROFEN 600 MG PO TABS
600.0000 mg | ORAL_TABLET | Freq: Four times a day (QID) | ORAL | Status: DC
  Administered 2015-04-07 – 2015-04-08 (×6): 600 mg via ORAL
  Filled 2015-04-06 (×6): qty 1

## 2015-04-06 MED ORDER — SENNOSIDES-DOCUSATE SODIUM 8.6-50 MG PO TABS
2.0000 | ORAL_TABLET | ORAL | Status: DC
  Administered 2015-04-06: 2 via ORAL
  Filled 2015-04-06: qty 2

## 2015-04-06 MED ORDER — FENTANYL CITRATE (PF) 250 MCG/5ML IJ SOLN
INTRAMUSCULAR | Status: DC | PRN
  Administered 2015-04-06: 20 ug via INTRATHECAL

## 2015-04-06 MED ORDER — ONDANSETRON HCL 4 MG/2ML IJ SOLN: 4.0000 mg | Freq: Once | INTRAMUSCULAR | Status: DC | PRN

## 2015-04-06 MED ORDER — LANOLIN HYDROUS EX OINT: 1.0000 "application " | TOPICAL_OINTMENT | CUTANEOUS | Status: DC | PRN

## 2015-04-06 MED ORDER — LACTATED RINGERS IV SOLN
INTRAVENOUS | Status: DC
  Administered 2015-04-06: 14:00:00 via INTRAVENOUS
  Administered 2015-04-06: 125 mL/h via INTRAVENOUS

## 2015-04-06 MED ORDER — DIPHENHYDRAMINE HCL 50 MG/ML IJ SOLN
12.5000 mg | INTRAMUSCULAR | Status: DC | PRN
Start: 2015-04-06 — End: 2015-04-08

## 2015-04-06 MED ORDER — BUPIVACAINE 0.25 % ON-Q PUMP DUAL CATH 400 ML
400.0000 mL | INJECTION | Status: DC
  Filled 2015-04-06: qty 400

## 2015-04-06 MED ORDER — NALOXONE HCL 1 MG/ML IJ SOLN
1.0000 ug/kg/h | INTRAVENOUS | Status: DC | PRN
  Filled 2015-04-06: qty 2

## 2015-04-06 MED ORDER — OXYTOCIN 40 UNITS IN LACTATED RINGERS INFUSION - SIMPLE MED: 62.5000 mL/h | INTRAVENOUS | Status: AC

## 2015-04-06 MED ORDER — DIPHENHYDRAMINE HCL 25 MG PO CAPS: 25.0000 mg | ORAL_CAPSULE | Freq: Four times a day (QID) | ORAL | Status: DC | PRN

## 2015-04-06 MED ORDER — FENTANYL CITRATE (PF) 100 MCG/2ML IJ SOLN: 25.0000 ug | INTRAMUSCULAR | Status: DC | PRN

## 2015-04-06 MED ORDER — PHENYLEPHRINE HCL 10 MG/ML IJ SOLN
INTRAMUSCULAR | Status: DC | PRN
  Administered 2015-04-06 (×2): 100 ug via INTRAVENOUS

## 2015-04-06 MED ORDER — KETOROLAC TROMETHAMINE 30 MG/ML IJ SOLN
30.0000 mg | Freq: Four times a day (QID) | INTRAMUSCULAR | Status: DC | PRN
  Filled 2015-04-06: qty 1

## 2015-04-06 MED ORDER — MORPHINE SULFATE (PF) 0.5 MG/ML IJ SOLN
INTRAMUSCULAR | Status: DC | PRN
  Administered 2015-04-06: .2 mg via INTRATHECAL

## 2015-04-06 MED ORDER — MENTHOL 3 MG MT LOZG
1.0000 | LOZENGE | OROMUCOSAL | Status: DC | PRN
  Filled 2015-04-06: qty 9

## 2015-04-06 MED ORDER — NALBUPHINE HCL 10 MG/ML IJ SOLN
5.0000 mg | Freq: Once | INTRAMUSCULAR | Status: DC | PRN
  Filled 2015-04-06: qty 0.5

## 2015-04-06 MED ORDER — MEPERIDINE HCL 25 MG/ML IJ SOLN: 6.2500 mg | INTRAMUSCULAR | Status: DC | PRN

## 2015-04-06 MED ORDER — NALBUPHINE HCL 10 MG/ML IJ SOLN
5.0000 mg | INTRAMUSCULAR | Status: DC | PRN
  Filled 2015-04-06: qty 0.5

## 2015-04-06 MED ORDER — BUPIVACAINE HCL (PF) 0.5 % IJ SOLN
INTRAMUSCULAR | Status: DC | PRN
  Administered 2015-04-06: 5 mL

## 2015-04-06 MED ORDER — OXYCODONE-ACETAMINOPHEN 5-325 MG PO TABS
1.0000 | ORAL_TABLET | ORAL | Status: DC | PRN
Start: 2015-04-06 — End: 2015-04-08

## 2015-04-06 MED ORDER — LACTATED RINGERS IV SOLN
INTRAVENOUS | Status: DC
  Administered 2015-04-06 – 2015-04-07 (×3): via INTRAVENOUS

## 2015-04-06 MED ORDER — HYDROMORPHONE HCL 1 MG/ML IJ SOLN: 0.2500 mg | INTRAMUSCULAR | Status: DC | PRN

## 2015-04-06 MED ORDER — ACETAMINOPHEN 325 MG PO TABS: 650.0000 mg | ORAL_TABLET | ORAL | Status: DC | PRN

## 2015-04-06 MED ORDER — SIMETHICONE 80 MG PO CHEW: 80.0000 mg | CHEWABLE_TABLET | ORAL | Status: DC | PRN

## 2015-04-06 MED ORDER — PRENATAL MULTIVITAMIN CH
1.0000 | ORAL_TABLET | Freq: Every day | ORAL | Status: DC
  Administered 2015-04-07: 1 via ORAL
  Filled 2015-04-06 (×2): qty 1

## 2015-04-06 MED ORDER — DIPHENHYDRAMINE HCL 25 MG PO CAPS: 25.0000 mg | ORAL_CAPSULE | ORAL | Status: DC | PRN

## 2015-04-06 MED ORDER — NICOTINE 7 MG/24HR TD PT24
7.0000 mg | MEDICATED_PATCH | Freq: Every day | TRANSDERMAL | Status: DC
  Administered 2015-04-06 – 2015-04-08 (×3): 7 mg via TRANSDERMAL
  Filled 2015-04-06 (×4): qty 1

## 2015-04-06 MED ORDER — OXYCODONE-ACETAMINOPHEN 5-325 MG PO TABS: 2.0000 | ORAL_TABLET | ORAL | Status: DC | PRN

## 2015-04-06 MED ORDER — SIMETHICONE 80 MG PO CHEW: 80.0000 mg | CHEWABLE_TABLET | ORAL | Status: DC

## 2015-04-06 MED ORDER — BUPIVACAINE HCL (PF) 0.25 % IJ SOLN
8.0000 mL | INTRAMUSCULAR | Status: DC
  Filled 2015-04-06: qty 10

## 2015-04-06 MED ORDER — KETOROLAC TROMETHAMINE 30 MG/ML IJ SOLN
30.0000 mg | Freq: Four times a day (QID) | INTRAMUSCULAR | Status: AC | PRN
  Administered 2015-04-06 – 2015-04-07 (×3): 30 mg via INTRAVENOUS
  Filled 2015-04-06 (×4): qty 1

## 2015-04-06 MED ORDER — SIMETHICONE 80 MG PO CHEW
80.0000 mg | CHEWABLE_TABLET | Freq: Three times a day (TID) | ORAL | Status: DC
  Administered 2015-04-06 – 2015-04-08 (×6): 80 mg via ORAL
  Filled 2015-04-06 (×6): qty 1

## 2015-04-06 MED ORDER — ONDANSETRON HCL 4 MG/2ML IJ SOLN: 4.0000 mg | Freq: Three times a day (TID) | INTRAMUSCULAR | Status: DC | PRN

## 2015-04-06 MED ORDER — EPHEDRINE SULFATE 50 MG/ML IJ SOLN
INTRAMUSCULAR | Status: DC | PRN
  Administered 2015-04-06: 5 mg via INTRAVENOUS

## 2015-04-06 MED ORDER — SCOPOLAMINE 1 MG/3DAYS TD PT72
1.0000 | MEDICATED_PATCH | Freq: Once | TRANSDERMAL | Status: DC
  Filled 2015-04-06: qty 1

## 2015-04-06 MED ORDER — CEFAZOLIN SODIUM-DEXTROSE 2-3 GM-% IV SOLR
2.0000 g | INTRAVENOUS | Status: AC
Start: 2015-04-06 — End: 2015-04-06
  Administered 2015-04-06: 2 g via INTRAVENOUS

## 2015-04-06 MED ORDER — ONDANSETRON HCL 4 MG/2ML IJ SOLN
INTRAMUSCULAR | Status: DC | PRN
  Administered 2015-04-06: 4 mg via INTRAVENOUS

## 2015-04-06 SURGICAL SUPPLY — 25 items
BAG COUNTER SPONGE EZ (MISCELLANEOUS) ×2 IMPLANT
CANISTER SUCT 3000ML (MISCELLANEOUS) ×3 IMPLANT
CATH KIT ON-Q SILVERSOAK 5IN (CATHETERS) ×6 IMPLANT
CHLORAPREP W/TINT 26ML (MISCELLANEOUS) ×6 IMPLANT
CLOSURE WOUND 1/2 X4 (GAUZE/BANDAGES/DRESSINGS) ×1
COUNTER SPONGE BAG EZ (MISCELLANEOUS) ×1
DRSG TELFA 3X8 NADH (GAUZE/BANDAGES/DRESSINGS) ×3 IMPLANT
ELECT CAUTERY BLADE 6.4 (BLADE) ×3 IMPLANT
GAUZE SPONGE 4X4 12PLY STRL (GAUZE/BANDAGES/DRESSINGS) ×3 IMPLANT
GLOVE BIO SURGEON STRL SZ7 (GLOVE) ×3 IMPLANT
GLOVE INDICATOR 7.5 STRL GRN (GLOVE) ×3 IMPLANT
GOWN STRL REUS W/ TWL LRG LVL3 (GOWN DISPOSABLE) ×3 IMPLANT
GOWN STRL REUS W/TWL LRG LVL3 (GOWN DISPOSABLE) ×6
LIQUID BAND (GAUZE/BANDAGES/DRESSINGS) ×3 IMPLANT
NS IRRIG 1000ML POUR BTL (IV SOLUTION) ×3 IMPLANT
PACK C SECTION AR (MISCELLANEOUS) ×3 IMPLANT
PAD GROUND ADULT SPLIT (MISCELLANEOUS) ×3 IMPLANT
PAD OB MATERNITY 4.3X12.25 (PERSONAL CARE ITEMS) ×3 IMPLANT
PAD PREP 24X41 OB/GYN DISP (PERSONAL CARE ITEMS) ×3 IMPLANT
STRIP CLOSURE SKIN 1/2X4 (GAUZE/BANDAGES/DRESSINGS) ×2 IMPLANT
SUT MNCRL AB 4-0 PS2 18 (SUTURE) IMPLANT
SUT PDS AB 1 TP1 96 (SUTURE) ×6 IMPLANT
SUT VIC AB 0 CTX 36 (SUTURE) ×4
SUT VIC AB 0 CTX36XBRD ANBCTRL (SUTURE) ×2 IMPLANT
SUT VIC AB 2-0 CT1 36 (SUTURE) ×3 IMPLANT

## 2015-04-06 NOTE — Anesthesia Procedure Notes (Signed)
Spinal Patient location during procedure: OR Start time: 04/06/2015 11:09 AM Staffing Performed by: anesthesiologist  Preanesthetic Checklist Completed: patient identified, site marked, surgical consent, pre-op evaluation, timeout performed, IV checked, risks and benefits discussed and monitors and equipment checked Spinal Block Patient position: sitting Prep: Betadine Patient monitoring: heart rate, cardiac monitor, continuous pulse ox and blood pressure Approach: midline Location: L4-5 Injection technique: single-shot Needle Needle type: Tuohy  Needle gauge: 24 G Needle length: 9 cm Needle insertion depth: 7 cm Assessment Sensory level: T4 Additional Notes Tolerated well vsst

## 2015-04-06 NOTE — OR Nursing (Signed)
Placenta labeled and placed into placenta fridge for 7 days.

## 2015-04-06 NOTE — Transfer of Care (Signed)
Immediate Anesthesia Transfer of Care Note  Patient: Lillia CorporalChandra Wandel  Procedure(s) Performed: Procedure(s): CESAREAN SECTION (N/A)  Patient Location: PACU  Anesthesia Type:Spinal  Level of Consciousness: awake, alert  and oriented  Airway & Oxygen Therapy: Patient Spontanous Breathing  Post-op Assessment: Report given to RN and Post -op Vital signs reviewed and stable  Post vital signs: stable  Last Vitals:  Filed Vitals:   04/06/15 1224  BP: 110/61  Pulse: 58  Temp: 36.4 C  Resp: 12    Complications: No apparent anesthesia complications

## 2015-04-06 NOTE — Anesthesia Preprocedure Evaluation (Signed)
Anesthesia Evaluation    History of Anesthesia Complications (+) PONV  Airway Mallampati: II       Dental   Pulmonary Current Smoker,          Cardiovascular Rhythm:Regular     Neuro/Psych    GI/Hepatic   Endo/Other    Renal/GU      Musculoskeletal   Abdominal   Peds  Hematology  (+) anemia ,   Anesthesia Other Findings   Reproductive/Obstetrics                             Anesthesia Physical Anesthesia Plan  ASA: II  Anesthesia Plan: Spinal   Post-op Pain Management:    Induction:   Airway Management Planned:   Additional Equipment:   Intra-op Plan:   Post-operative Plan:   Informed Consent: I have reviewed the patients History and Physical, chart, labs and discussed the procedure including the risks, benefits and alternatives for the proposed anesthesia with the patient or authorized representative who has indicated his/her understanding and acceptance.     Plan Discussed with: CRNA  Anesthesia Plan Comments:         Anesthesia Quick Evaluation

## 2015-04-06 NOTE — Op Note (Signed)
Preoperative Diagnosis: 1) 39 y.o. [id number] at 3099w0d 2) Polyhydramnios 3) History of prior C-section desires repeat  Postoperative Diagnosis: 1) 39 y.o. [id number] at 4499w0d 2) Polyhydramnios 3) History of prior C-section desires repeat  Operation Performed: Repeat low transverse C-section via pfannenstiel skin incision  Indiciation: Desires repeat C-section     Anesthesia: .Epidural  Primary Surgeon: Vena AustriaAndreas Ahnyla Mendel, MD   Assistant: Medley BingPickens, Charlie   Preoperative Antibiotics: 2g Ancef IV   Estimated Blood Loss: 600mL  IV Fluids: 1500mL  Urine Output:: 100mL  Drains or Tubes: Foley to gravity drainage, ON-Q catheter system  Implants: none  Specimens Removed: none  Complications: none  Intraoperative Findings:  Normal tubes ovaries and uterus.  Delivery resulted in the birth of a liveborn female infant, APGAR (1 MIN): 9   APGAR (5 MINS): 9   APGAR (10 MINS):  , weight  8lbs 10oz  Patient Condition:stable  Procedure in Detail:  Patient was taken to the operating room were she was administered regional anesthesia.  She was positioned in the supine position, prepped and draped in the  Usual sterile fashion.  Prior to proceeding with the case a time out was performed and the level of anesthetic was checked and noted to be adequate.  Utilizing the scalpel a pfannenstiel skin incision was made 2cm above the pubic symphysis utilizing the patient's pre-existing scar and carried down sharply to the the level of the rectus fascia.  The fascia was incised in the midline using the scalpel and then extended using mayo scissors.  The superior border of the rectus fascia was grasped with two Kocher clamps and the underlying rectus muscles were dissected of the fascia using blunt dissection.  The median raphae was incised using Mayo scissors.   The inferior border of the rectus fascia was dissected of the rectus muscles in a similar fashion.  The midline was identified, the peritoneum was  entered bluntly and expanded using manual tractions.  The uterus was noted to be in a none rotated position.  Next the bladder blade was placed retracting the bladder caudally.  A bladder flap was not created.  A low transverse incision was scored on the lower uterine segment.  The hysterotomy was entered bluntly using the operators finger.  The hysterotomy incision was extended using manual traction.  The operators hand was placed within the hysterotomy position noting the fetus to be within the OA position.  The vertex was grasped, flexed, brought to the incision, and delivered a traumatically using fundal pressure.  The remainder of the body delivered with ease.  The infant was suctioned, cord was clamped and cut before handing off to the awaiting neonatologist.  The placenta was delivered using manual extraction.  The uterus was exteriorized, wiped clean of clots and debris using two moist laps.  The hysterotomy was closed using a two layer closure of 0 Vicryl, with the first being a running locked, the second a vertical imbricating.  The uterus was returned to the abdomen.  The peritoneal gutters were wiped clean of clots and debris using two moist laps.  The hysterotomy incision was re-inspected noted to be hemostatic.  The rectus muscles were re-approximated in the midline using a single 2-0 Vicryl mattress stitch.  The rectus muscles were inspected noted to be hemostatic.  The superior border of the rectus fascia was grasped with a Kocher clamp.  The ON-Q trocars were then placed 4cm above the superior border of the incision and tunneled subfascially.  The introducers were removed  and the catheters were threaded through the sleeves after which the sleeves were removed.  The fascia was closed using a looped #1 PDS in a running fashion taking 1cm by 1cm bites.  The subcutaneous tissue was irrigated using warm saline, hemostasis achieved using the bovis.  The subcutaneous dead space was less than 3cm and was  not closed.  The sking was closed using 4-0 Monocryl in subcuticular fashion.  Sponge needle and instrument counts were corrects times two.  The patient tolerated the procedure well and was taken to the recovery room in stable condition.

## 2015-04-06 NOTE — H&P (Addendum)
Date of Initial H&P: 04/05/2015 (hardcopy paper H&P)  History reviewed, patient examined, no change in status, stable for surgery.

## 2015-04-07 ENCOUNTER — Encounter: Payer: Self-pay | Admitting: Certified Nurse Midwife

## 2015-04-07 ENCOUNTER — Other Ambulatory Visit: Payer: Self-pay

## 2015-04-07 LAB — CBC
HCT: 32.9 % — ABNORMAL LOW (ref 35.0–47.0)
Hemoglobin: 10.7 g/dL — ABNORMAL LOW (ref 12.0–16.0)
MCH: 29.2 pg (ref 26.0–34.0)
MCHC: 32.4 g/dL (ref 32.0–36.0)
MCV: 90.1 fL (ref 80.0–100.0)
PLATELETS: 171 10*3/uL (ref 150–440)
RBC: 3.66 MIL/uL — ABNORMAL LOW (ref 3.80–5.20)
RDW: 14 % (ref 11.5–14.5)
WBC: 13 10*3/uL — ABNORMAL HIGH (ref 3.6–11.0)

## 2015-04-07 LAB — FETAL SCREEN: FETAL SCREEN: NEGATIVE

## 2015-04-07 MED ORDER — RHO D IMMUNE GLOBULIN 1500 UNIT/2ML IJ SOSY
300.0000 ug | PREFILLED_SYRINGE | Freq: Once | INTRAMUSCULAR | Status: AC
  Administered 2015-04-07: 300 ug via INTRAMUSCULAR
  Filled 2015-04-07: qty 2

## 2015-04-07 NOTE — Anesthesia Post-op Follow-up Note (Signed)
  Anesthesia Pain Follow-up Note  Patient: Laura Potts  [name]: 1  Date of Follow-up: 04/07/2015 Time: 7:34 AM  Last Vitals:  Filed Vitals:   04/07/15 0322  BP: 104/59  Pulse: 79  Temp: 36.4 C  Resp: 18    Level of Consciousness: alert  Pain: mild   Side Effects:None  Catheter Site Exam:clean, dry, no drainage  Plan: D/C from anesthesia care  Zachary GeorgeWeatherly,  Tayron Hunnell F

## 2015-04-07 NOTE — Anesthesia Postprocedure Evaluation (Signed)
  Anesthesia Post-op Note  Patient: Laura Potts  Procedure(s) Performed: Procedure(s): CESAREAN SECTION (N/A)  Anesthesia type:Spinal  Patient location: 351  Post pain: Pain level controlled  Post assessment: Post-op Vital signs reviewed, Patient's Cardiovascular Status Stable, Respiratory Function Stable, Patent Airway and No signs of Nausea or vomiting  Post vital signs: Reviewed and stable  Last Vitals:  Filed Vitals:   04/07/15 0322  BP: 104/59  Pulse: 79  Temp: 36.4 C  Resp: 18    Level of consciousness: awake, alert  and patient cooperative  Complications: No apparent anesthesia complications

## 2015-04-07 NOTE — Progress Notes (Signed)
POD #1 Repeat Cesarean section  S: Doing well. OOB ambulating without assist Has been passing gas and had a loose stool Breastfeeding Voiding wihout difficulty  O: afebrile 126/72 Heart: RRR without murmur Lungs: CTA all fields ABD: BS active. Incision C+D+I Lochia: minimal No calf tenderness A: stable pod #1  P: Continue postoperative/postpartum care DC senna

## 2015-04-08 ENCOUNTER — Encounter: Payer: Self-pay | Admitting: Obstetrics and Gynecology

## 2015-04-08 LAB — RHOGAM INJECTION: Unit division: 0

## 2015-04-08 MED ORDER — IBUPROFEN 600 MG PO TABS: 600.0000 mg | ORAL_TABLET | Freq: Four times a day (QID) | ORAL | Status: AC

## 2015-04-08 MED ORDER — OXYCODONE-ACETAMINOPHEN 5-325 MG PO TABS: 1.0000 | ORAL_TABLET | ORAL | Status: AC | PRN

## 2015-04-08 MED ORDER — ACETAMINOPHEN 325 MG PO TABS: 650.0000 mg | ORAL_TABLET | ORAL | Status: AC | PRN

## 2015-04-08 NOTE — Discharge Summary (Signed)
Obstetrical Discharge Summary  Date of Admission: 04/06/2015 Date of Discharge: 04/08/15  Primary OB: [facility]   Gestational Age at Delivery: 3228w0d   Antepartum complications: polyhydramnios Date of Delivery: 04/06/15 Delivered By: Dr. Bonney AidStaebler Delivery Type: repeat cesarean section, low transverse incision Intrapartum complications/course: None Anesthesia: epidural Placenta: spontaneous Laceration: n/a Episiotomy: Pfannensteil Baby: Liveborn female, Apgars 8/9, weight 8lb 10oz.   Post partum course: normal postpartum, uneventful recovery. Disposition: discharge home  Rh Immune globulin given: yes Rubella vaccine given: no Tdap vaccine given in AP or PP setting: yes Flu vaccine given in AP or PP setting: n/a  Contraception: withdrawal method  Prenatal Labs: O NEG//Rubella Immune//RPR negative//HIV negative/HepB Surface Ag negative//pap no abnormalities (date: 09/23/14)//plans to breastfeed//female//circ not applicable  Plan:  Laura Potts was discharged to home in good condition. Follow-up appointment with Dr. Bonney AidStaebler in 1 week for incision check.  No future appointments.  Discharge Medications:   Medication List    ASK your doctor about these medications        ondansetron 4 MG tablet  Commonly known as:  ZOFRAN  Take 1 tablet (4 mg total) by mouth every 6 (six) hours.     PRENATAL 1 + IRON PO  Take 1 tablet by mouth daily.        Ranae Plumberhelsea Torrey Horseman, MD [facility]

## 2015-04-08 NOTE — Progress Notes (Signed)
Discharge order in by Dr. Elesa MassedWard. Instructions reviewed with patient.Pt v/u of all instructions. Prescription given to pt. ID bands of mom and infant matched. Escorted by auxillary via w/c in stable condition, infant in arms.

## 2015-04-08 NOTE — Discharge Instructions (Signed)
No intercourse for 6 weeks, no tampons, douching or enemas. No heavy lifting, nothing heavier than the baby. No driving for 2 weeks. Call your doctor for fever >100.4, increase in pain, incisional concerns (redness, oozing pus), heavy bleeding (changing your pad every hour or passing clots the size of your palm); for chest pain, blurred vision, seeing spots, bad headache that won't go away come to the ER.

## 2015-12-03 IMAGING — US US OB < 14 WEEKS - US OB TV
1 series · 14 of 28 positions shown · non-contrast
Comparison: None.

CLINICAL DATA: Pelvic pain of acute onset

EXAM:
OBSTETRIC <14 WK US AND TRANSVAGINAL OB US
TECHNIQUE: Both transabdominal and transvaginal ultrasound examinations were
performed for complete evaluation of the gestation as well as the
maternal uterus, adnexal regions, and pelvic cul-de-sac.
Transvaginal technique was performed to assess early pregnancy.

[Series 1: us ob < 14 weeks - us ob tv · 0.20mm/px · 14 of 50 slices shown]
[im 2/50]
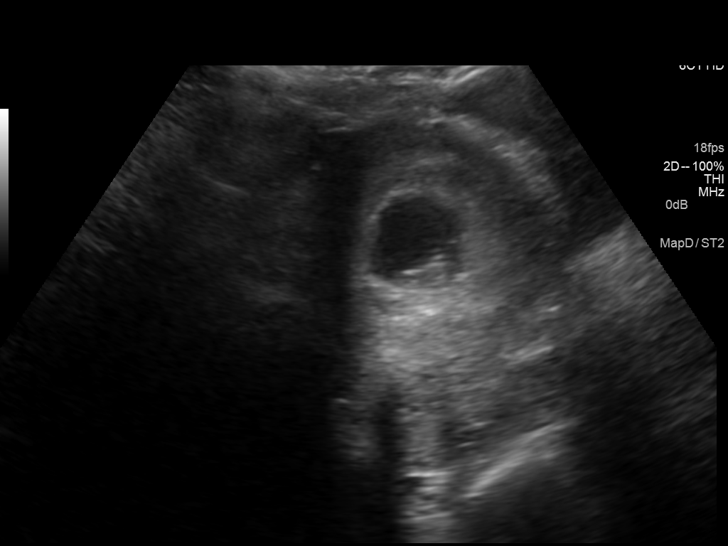
[im 6/50]
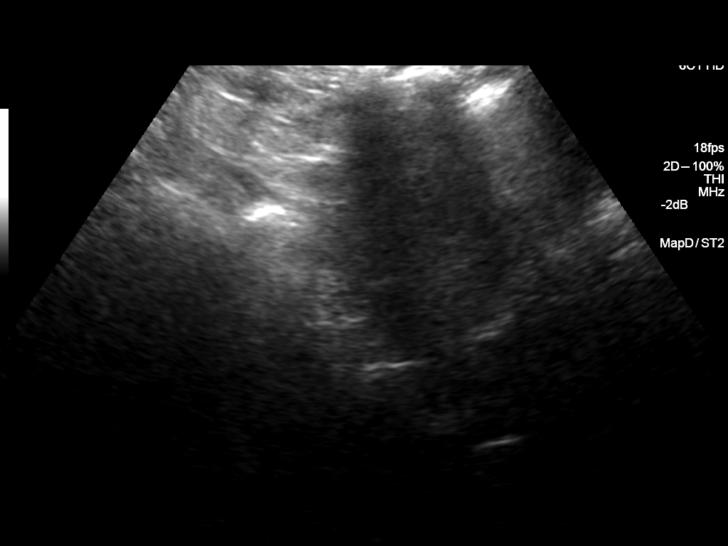
[im 10/50]
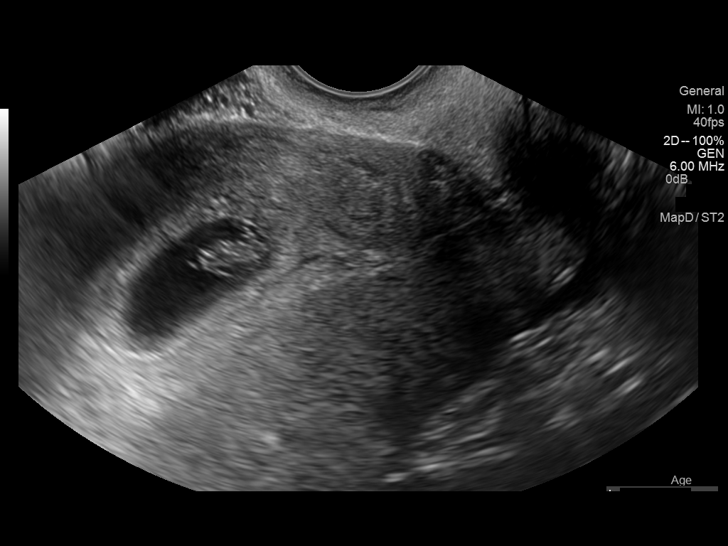
[im 13/50]
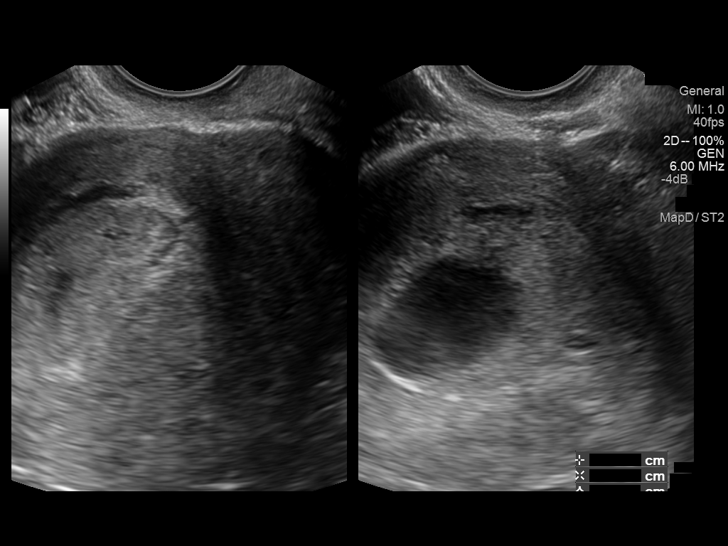
[im 17/50]
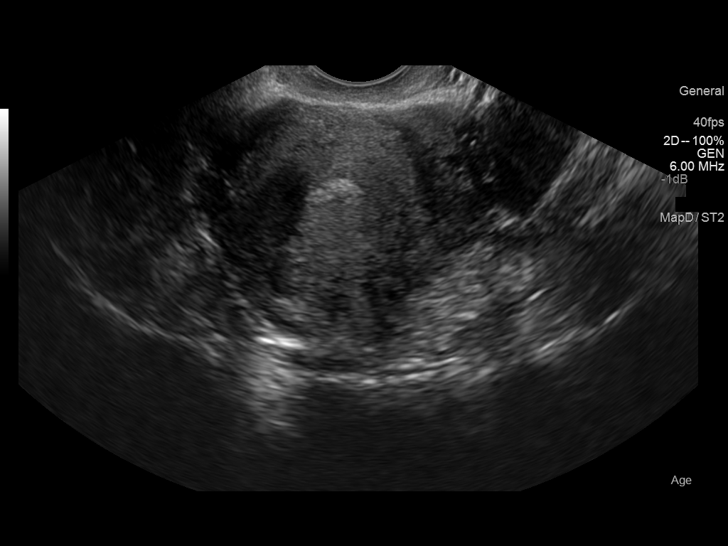
[im 20/50]
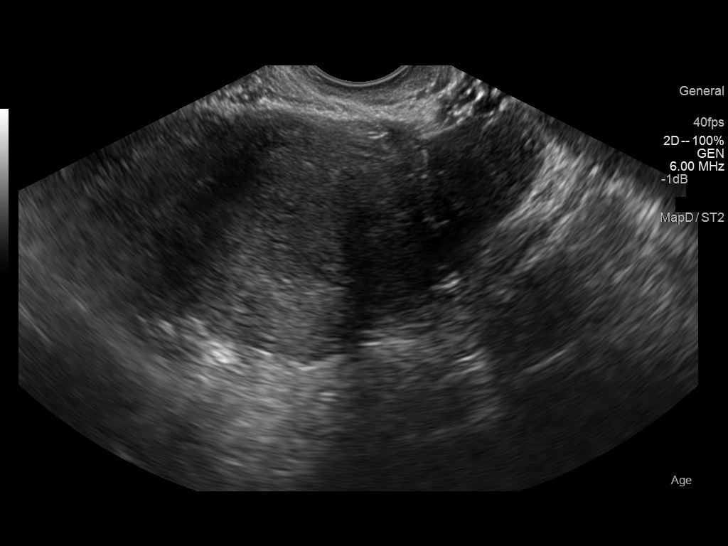
[im 24/50]
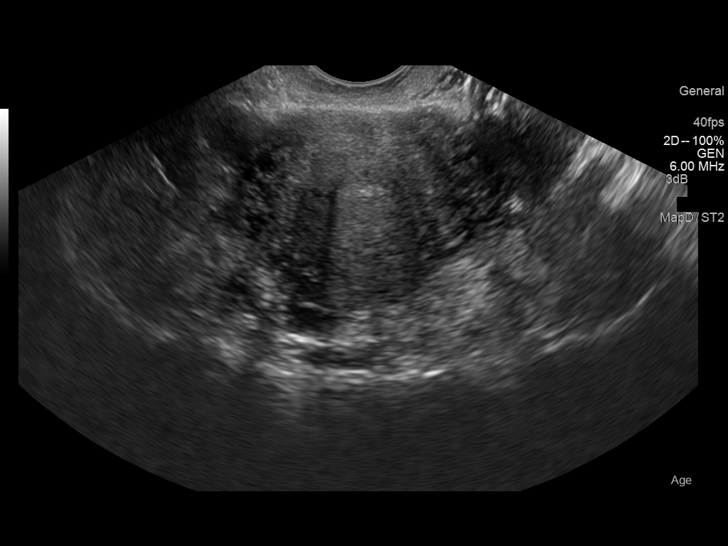
[im 28/50]
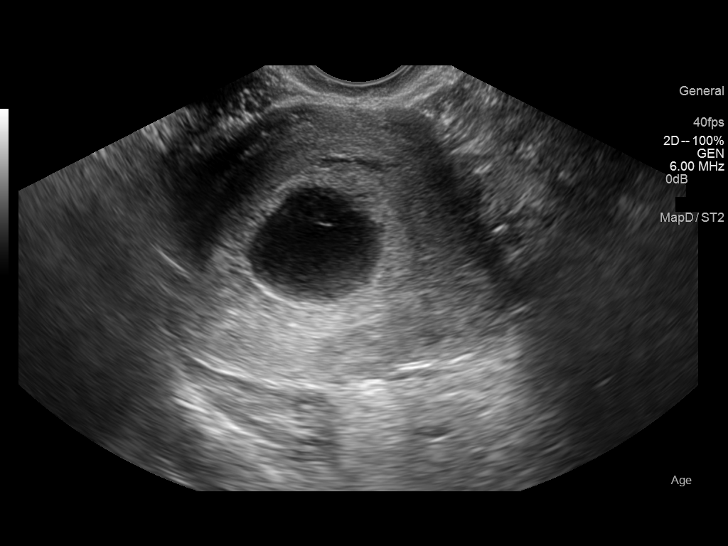
[im 31/50]
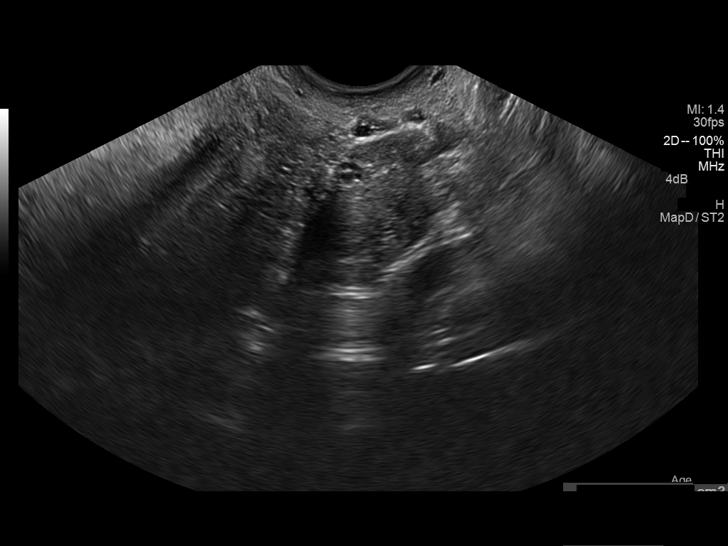
[im 35/50]
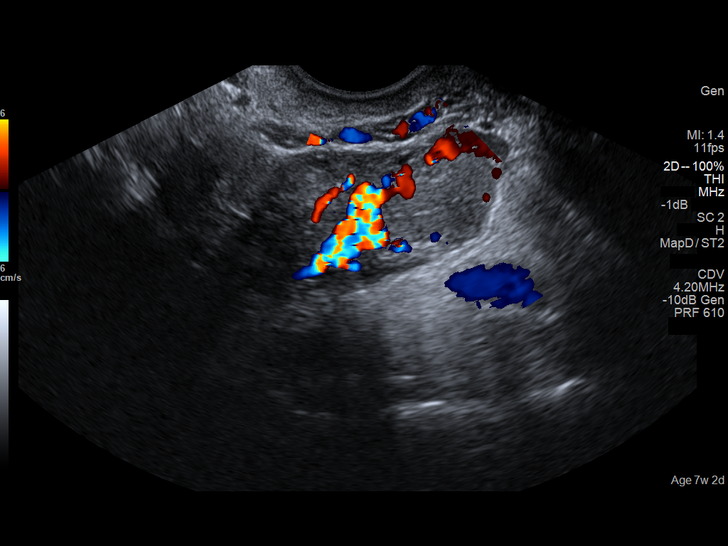
[im 39/50]
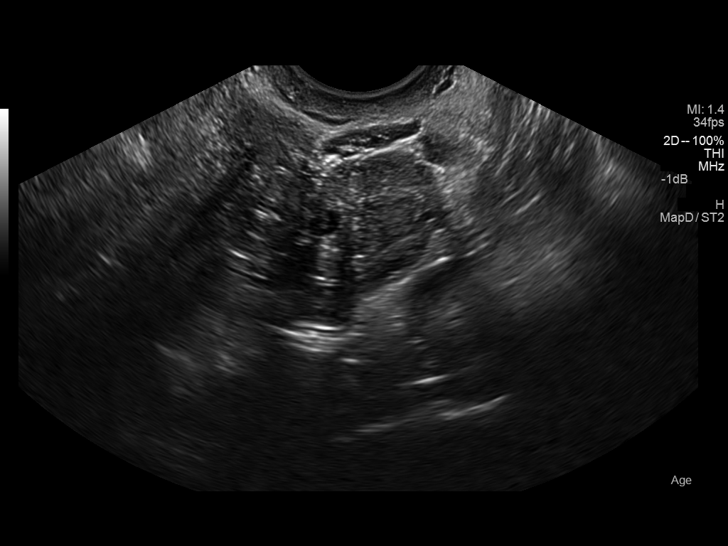
[im 42/50]
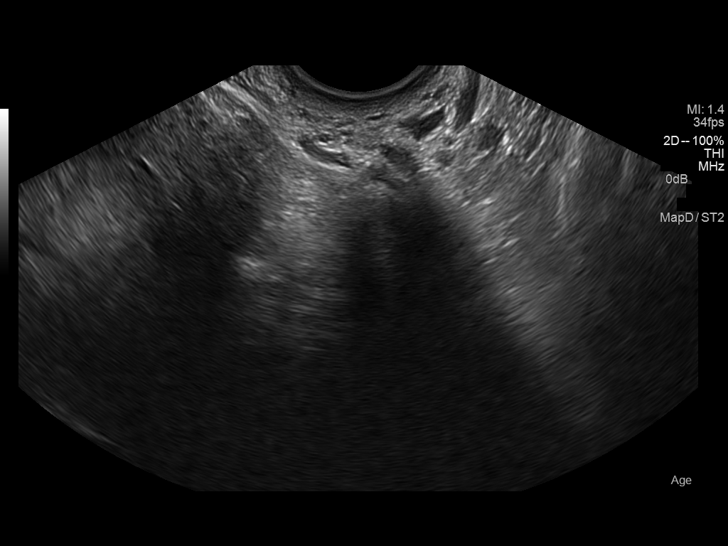
[im 46/50]
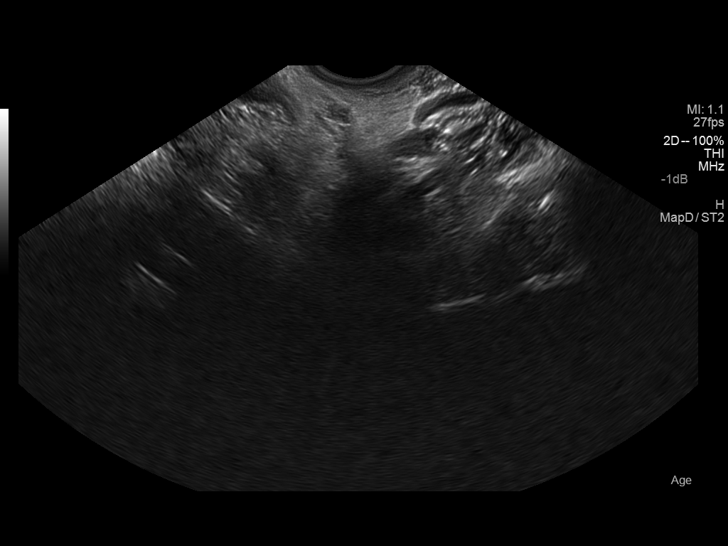
[im 50/50]
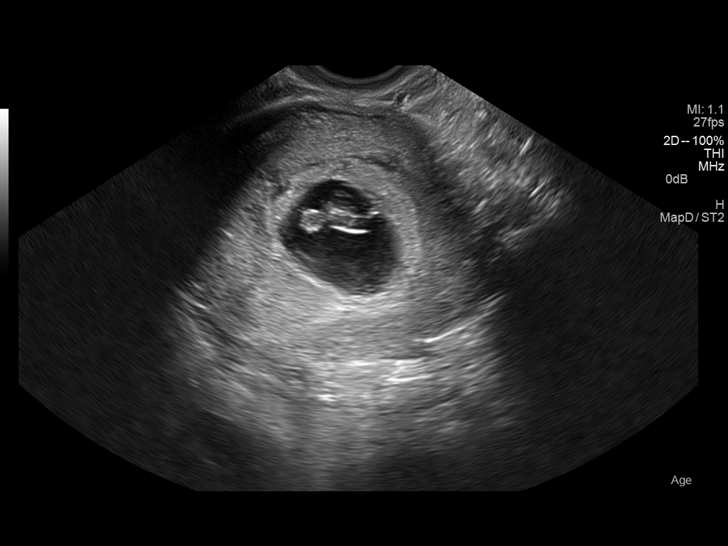

[14 of 28 positions shown; findings below may reference images not displayed]

FINDINGS: Intrauterine gestational sac: Visualized/normal in shape.

Yolk sac:  Visualized

Embryo:  Visualized

Cardiac Activity: Visualized

Heart Rate:  144 bpm

CRL:   12  mm   7 w 2 d                  US EDC: April 13, 2015

Maternal uterus/adnexae: There is a small subchorionic hemorrhage
measuring 1.5 x 0.3 x 1.1 cm. Maternal uterus otherwise appears
normal. Cervical os is closed. There is a probable small corpus
luteum arising from the left ovary measuring 1.7 x 1.9 x 1.4 cm, a
physiologic finding. Beyond this physiologic finding, there is no
maternal extrauterine pelvic mass. No maternal free fluid.
IMPRESSION: Single live intrauterine gestation with estimated gestational age of
7+ weeks. Small subchorionic hemorrhage. Study otherwise
unremarkable.

## 2018-11-28 ENCOUNTER — Encounter (HOSPITAL_BASED_OUTPATIENT_CLINIC_OR_DEPARTMENT_OTHER): Payer: Self-pay | Admitting: Neurological Surgery

## 2018-12-02 ENCOUNTER — Telehealth (HOSPITAL_BASED_OUTPATIENT_CLINIC_OR_DEPARTMENT_OTHER): Payer: Self-pay | Admitting: Nurse Practitioner

## 2018-12-02 NOTE — Telephone Encounter (Addendum)
I tried to contact patient re: referral but current phone listed is not in service. I was able to speak with Natalie Tyler, patient's mother, who was listed in the contact list and reports that the patient currently does not have a personal phone. She will call the patient and let her know that I need to ask her a few questions to complete her referral review.

## 2018-12-09 NOTE — Telephone Encounter (Signed)
Discussed referral with Dr. Hannah Beat. He would prefer the patient to be managed by her PCP, but if patient wants formal spine surgery opinion, she can be scheduled in February or March after she has done PT.

## 2019-02-20 ENCOUNTER — Encounter (HOSPITAL_BASED_OUTPATIENT_CLINIC_OR_DEPARTMENT_OTHER): Payer: 59 | Admitting: Neurological Surgery

## 2019-04-09 ENCOUNTER — Telehealth (HOSPITAL_BASED_OUTPATIENT_CLINIC_OR_DEPARTMENT_OTHER): Payer: Self-pay | Admitting: Neurological Surgery

## 2019-04-09 NOTE — Telephone Encounter (Signed)
LVM for pt that her 5/19 1:00pm  clinic visit with Dr. Hannah Beat has been moved to a 04/15/19 1:00pm phone visit. Will mail her the clinic questionnaire for completion.

## 2019-04-15 ENCOUNTER — Encounter (HOSPITAL_BASED_OUTPATIENT_CLINIC_OR_DEPARTMENT_OTHER): Payer: Self-pay | Admitting: Neurological Surgery

## 2019-04-15 ENCOUNTER — Encounter (HOSPITAL_BASED_OUTPATIENT_CLINIC_OR_DEPARTMENT_OTHER): Payer: 59 | Admitting: Neurological Surgery

## 2019-04-15 ENCOUNTER — Ambulatory Visit (HOSPITAL_BASED_OUTPATIENT_CLINIC_OR_DEPARTMENT_OTHER): Payer: 59 | Admitting: Neurological Surgery

## 2019-04-15 DIAGNOSIS — S22070D Wedge compression fracture of T9-T10 vertebra, subsequent encounter for fracture with routine healing: Secondary | ICD-10-CM

## 2019-04-15 DIAGNOSIS — M546 Pain in thoracic spine: Secondary | ICD-10-CM

## 2019-04-15 DIAGNOSIS — G8929 Other chronic pain: Secondary | ICD-10-CM

## 2019-04-15 MED ORDER — DICLOFENAC SODIUM 1 % TD GEL
2.0000 g | Freq: Four times a day (QID) | TRANSDERMAL | 1 refills | Status: AC
Start: 2019-04-15 — End: ?

## 2019-04-15 NOTE — Progress Notes (Signed)
-------------------------------------------------------------------------------    I spoke to the patient by phone with Natalie Tyler and agree with note as documented above. T10 compression fx with minimal loss of height as of 10/2018.  No recent imaging. No formal PT.    - new AP/lateral TL spine xray  - trial of voltaren gel  - PT  - phone f/u after xray is complete    Noberto Retort, MD, PhD    We spent a total time of 40 minutes by phone with the patient, of which more than 50% was spent counseling and coordinating care as outlined in this note.
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
## 2019-06-03 ENCOUNTER — Ambulatory Visit (HOSPITAL_BASED_OUTPATIENT_CLINIC_OR_DEPARTMENT_OTHER): Payer: 59 | Admitting: Neurological Surgery

## 2019-06-03 DIAGNOSIS — S22070D Wedge compression fracture of T9-T10 vertebra, subsequent encounter for fracture with routine healing: Secondary | ICD-10-CM

## 2019-06-03 NOTE — Progress Notes (Deleted)
This visit is being conducted over the telephone at the patient's request: {Yes/No:29038::"Yes"}  Patient gives verbal consent to proceed and knows there may be a copay/deductible: {Yes/No:29038::"Yes"}    Time spent with patient/guardian on this telephone visit: {add # of minutes AFTER visit completed} minutes    Given the importance of social distancing and other strategies recommended to reduce the risk of COVID-19 transmission, I am providing medical care to this patient via a telephone visit in place of an in person visit at the request of the patient.      Patient Referred By: No ref. provider found  Patient's PCP: No primary care provider on file.      Subjective:  Natalie Tyler is a 43 year old female, Hx T10 compression fx with minimal loss of height, here for scheduled f/u.      - new AP/lateral TL spine xray  - trial of voltaren gel  - PT      Current Outpatient Medications   Medication Sig Dispense Refill   . Cholecalciferol (Vitamin D3) 75 MCG (3000 UT) tablet Take 3,000 Units by mouth.     . diclofenac 1 % gel Apply 2 g topically 4 times a day. Apply to back. 100 g 1   . naproxen 500 MG tablet Take 500 mg by mouth as needed.     . OXcarbazepine (Trileptal) 300 MG tablet Take 300 mg by mouth at bedtime as needed.       No current facility-administered medications for this visit.         Past Medical History:   Diagnosis Date   . Anxiety    . Compression fracture of body of thoracic vertebra (HCC)    . History of intravenous drug abuse [facility](HCC)        Past Surgical History:   Procedure Laterality Date   . ANES; CESAREAN DELIVERY+POSTPARTUM CARE      x2   . CHOLECYSTECTOMY     . UNLISTED PROCEDURE SPINE  2012    C5-7 ACDF       Social History     Socioeconomic History   . Marital status: Not on file     Spouse name: Not on file   . Number of children: Not on file   . Years of education: Not on file   . Highest education level: Not on file   Occupational History   . Not on file   Social Needs   . Financial  resource strain: Not on file   . Food insecurity     Worry: Not on file     Inability: Not on file   . Transportation needs     Medical: Not on file     Non-medical: Not on file   Tobacco Use   . Smoking status: Current Every Day Smoker     Packs/day: 0.50   Substance and Sexual Activity   . Alcohol use: Not Currently   . Drug use: Not Currently     Comment: Hx IVDU   . Sexual activity: Not on file   Lifestyle   . Physical activity     Days per week: Not on file     Minutes per session: Not on file   . Stress: Not on file   Relationships   . Social Wellsite geologistconnections     Talks on phone: Not on file     Gets together: Not on file     Attends religious service: Not on file  Active member of club or organization: Not on file     Attends meetings of clubs or organizations: Not on file     Relationship status: Not on file   . Intimate partner violence     Fear of current or ex partner: Not on file     Emotionally abused: Not on file     Physically abused: Not on file     Forced sexual activity: Not on file   Other Topics Concern   . Not on file   Social History Narrative   . Not on file       ROS:  ***    Imaging:  ***XR L spine 04/17/2019    Assessment and Plan:  No diagnosis found.    43 year old female, Hx ***    The patient will call in the interim with any questions or concerns.  The patient understands and is in agreement with this plan.     Darron Doom, MSPAS, PA-C  Physician Assistant  Department of Neurological Surgery  [facility]

## 2019-06-03 NOTE — Progress Notes (Signed)
Patient Referred By: No ref. provider found  Patient's PCP: No primary care provider on file.    This visit is conducted via telephone due to [place] and the patient's location at home.    Subjective:    Natalie Tyler is a 43 year old female, Hx T10 compression fracture with mild height loss. Thoracic pain is managed with meds.  She does stretching and light activity with her kids    Current Outpatient Medications   Medication Sig Dispense Refill   . Cholecalciferol (Vitamin D3) 75 MCG (3000 UT) tablet Take 3,000 Units by mouth.     . diclofenac 1 % gel Apply 2 g topically 4 times a day. Apply to back. 100 g 1   . naproxen 500 MG tablet Take 500 mg by mouth as needed.     . OXcarbazepine (Trileptal) 300 MG tablet Take 300 mg by mouth at bedtime as needed.       No current facility-administered medications for this visit.             Objective:  IMAGING  XR L spine shows T9-10 compression fx, no instability    Assessment and Plan:  Diagnoses and all orders for this visit:    Closed wedge compression fracture of tenth thoracic vertebra with routine healing, subsequent encounter        43 year old female, Hx T10 compression fracture with mild height loss.  Her symptoms are stable and tolerable with meds.  Upright xrays from May 2020 show no instability.    Light activity including walking, biking, swimming encouraged  F/u in spine clinic prn          I spent a total time of 15 minutes via phone with the patient, of which more than 50% was spent counseling and coordinating care as outlined in this note.

## 2019-06-03 NOTE — Progress Notes (Deleted)
Patient Referred By: No ref. provider found  Patient's PCP: No primary care provider on file.      Subjective:    Natalie Tyler is a 43 year old female, here to follow-up regarding There were no encounter diagnoses. ***      Current Outpatient Medications   Medication Sig Dispense Refill   . Cholecalciferol (Vitamin D3) 75 MCG (3000 UT) tablet Take 3,000 Units by mouth.     . diclofenac 1 % gel Apply 2 g topically 4 times a day. Apply to back. 100 g 1   . naproxen 500 MG tablet Take 500 mg by mouth as needed.     . OXcarbazepine (Trileptal) 300 MG tablet Take 300 mg by mouth at bedtime as needed.       No current facility-administered medications for this visit.             Objective:    Physical Exam   There were no vitals filed for this visit.    Neurological:   The patient is alert and oriented.     STRENGTH RIGHT LEFT   Deltoids 5/5 5/5   Biceps 5/5 5/5   Triceps 5/5 5/5   Wrist Extension 5/5 5/5   DIP 5/5 5/5   ADM 5/5 5/5      RIGHT LEFT   IP 5/5 5/5   Quad 5/5 5/5   TA 5/5 5/5   EHL 5/5 5/5   Gastroscnemius 5/5 5/5       Sensation intact to LT  No hyperreflexia  Hoffmans ***  Clonus ***    she stands with normal erect posture and no obvious sagittal imbalance  she walks with normal gait    No TTP    Assessment and Plan:  There are no diagnoses linked to this encounter.    43 year old female, Hx ***          I spent a total time of *** minutes face-to-face with the patient, of which more than 50% was spent counseling and coordinating care as outlined in this note.

## 2020-01-14 ENCOUNTER — Inpatient Hospital Stay: Payer: Self-pay

## 2020-02-22 ENCOUNTER — Emergency Department: Payer: Self-pay

## 2020-05-03 ENCOUNTER — Other Ambulatory Visit: Payer: Self-pay

## 2020-05-03 ENCOUNTER — Encounter: Payer: Self-pay | Admitting: *Deleted

## 2020-05-03 ENCOUNTER — Emergency Department
Admission: EM | Admit: 2020-05-03 | Discharge: 2020-05-03 | Disposition: A | Discharge status: Home / Self Care | Payer: Self-pay | Attending: Student | Admitting: Student

## 2020-05-03 ENCOUNTER — Emergency Department: Payer: Self-pay

## 2020-05-03 DIAGNOSIS — Y999 Unspecified external cause status: Secondary | ICD-10-CM | POA: Insufficient documentation

## 2020-05-03 DIAGNOSIS — S60012A Contusion of left thumb without damage to nail, initial encounter: Secondary | ICD-10-CM | POA: Insufficient documentation

## 2020-05-03 DIAGNOSIS — F17210 Nicotine dependence, cigarettes, uncomplicated: Secondary | ICD-10-CM | POA: Insufficient documentation

## 2020-05-03 DIAGNOSIS — S60222A Contusion of left hand, initial encounter: Secondary | ICD-10-CM

## 2020-05-03 DIAGNOSIS — W010XXA Fall on same level from slipping, tripping and stumbling without subsequent striking against object, initial encounter: Secondary | ICD-10-CM | POA: Insufficient documentation

## 2020-05-03 DIAGNOSIS — Y939 Activity, unspecified: Secondary | ICD-10-CM | POA: Insufficient documentation

## 2020-05-03 DIAGNOSIS — Y929 Unspecified place or not applicable: Secondary | ICD-10-CM | POA: Insufficient documentation

## 2020-05-03 MED ORDER — MELOXICAM 15 MG PO TABS: 15.0000 mg | ORAL_TABLET | Freq: Every day | ORAL | 0 refills | Status: AC

## 2020-05-03 MED ORDER — TRAMADOL HCL 50 MG PO TABS: 50.0000 mg | ORAL_TABLET | Freq: Four times a day (QID) | ORAL | 0 refills | Status: AC | PRN

## 2020-05-03 NOTE — ED Provider Notes (Signed)
[facility] Emergency Department Provider Note  ____________________________________________  Time seen: Approximately 6:19 PM  I have reviewed the triage vital signs and the nursing notes.   HISTORY  Chief Complaint Hand Injury    HPI Laura Potts is a 44 y.o. female who presents the emergency department complaining of left thumb pain, swelling, ecchymosis.  Patient states that she is visiting from [place] state.  She states that her dog, "a [place]"  right upper knee that her feet causing her to fall.  Patient states that she fell, smacking her hand on the floor.  Patient has had pain, swelling to the the thenar eminence since the injury.  She has reduced range of motion to the thumb due to pain.  No other injury or complaint.  Patient iced and use over-the-counter medications for symptom relief prior to arrival.  No history of previous injuries to the thumb.        Past Medical History:  Diagnosis Date  . Anemia   . Diverticulitis 2007    Patient Active Problem List   Diagnosis Date Noted  . History of C-section 04/06/2015  . S/P C-section 04/06/2015    Past Surgical History:  Procedure Laterality Date  . BACK SURGERY  09/18/2011   double discectomy  . CESAREAN SECTION  2008  . CESAREAN SECTION N/A 04/06/2015   Procedure: CESAREAN SECTION;  Surgeon: Malachy Mood, MD;  Location: [facility] ORS;  Service: Obstetrics;  Laterality: N/A;  . CHOLECYSTECTOMY      Prior to Admission medications   Medication Sig Start Date End Date Taking? Authorizing Provider  acetaminophen (TYLENOL) 325 MG tablet Take 2 tablets (650 mg total) by mouth every 4 (four) hours as needed (for pain scale < 4). 04/08/15   Ward, Honor Loh, MD  ibuprofen (ADVIL,MOTRIN) 600 MG tablet Take 1 tablet (600 mg total) by mouth every 6 (six) hours. 04/08/15   Ward, Honor Loh, MD  meloxicam (MOBIC) 15 MG tablet Take 1 tablet (15 mg total) by mouth daily. 05/03/20   Ziona Wickens, Charline Bills,  PA-C  oxyCODONE-acetaminophen (PERCOCET/ROXICET) 5-325 MG per tablet Take 1 tablet by mouth every 4 (four) hours as needed (for pain scale 4-7). 04/08/15   Ward, Honor Loh, MD  Prenatal Multivit-Min-Fe-FA (PRENATAL 1 + IRON PO) Take 1 tablet by mouth daily.    [provider]  traMADol (ULTRAM) 50 MG tablet Take 1 tablet (50 mg total) by mouth every 6 (six) hours as needed. 05/03/20   Joley Utecht, Charline Bills, PA-C    Allergies Patient has no known allergies.  Family History  Problem Relation Age of Onset  . Cancer Mother   . Heart disease Maternal Grandmother     Social History Social History   Tobacco Use  . Smoking status: Current Every Day Smoker    Packs/day: 0.25    Types: Cigarettes  . Smokeless tobacco: Never Used  Substance Use Topics  . Alcohol use: No    Comment: rarely  . Drug use: No     Review of Systems  Constitutional: No fever/chills Eyes: No visual changes. No discharge ENT: No upper respiratory complaints. Cardiovascular: no chest pain. Respiratory: no cough. No SOB. Gastrointestinal: No abdominal pain.  No nausea, no vomiting.  No diarrhea.  No constipation. Musculoskeletal: Pain, ecchymosis, swelling to the left thumb specifically the thenar eminence. Skin: Negative for rash, abrasions, lacerations, ecchymosis. Neurological: Negative for headaches, focal weakness or numbness. 10-point ROS otherwise negative.  ____________________________________________   PHYSICAL EXAM:  VITAL SIGNS:  ED Triage Vitals  Enc Vitals Group     BP 05/03/20 1558 (!) 113/56     Pulse Rate 05/03/20 1558 92     Resp 05/03/20 1558 18     Temp --      Temp Source 05/03/20 1558 Oral     SpO2 05/03/20 1558 97 %     Weight 05/03/20 1559 180 lb (81.6 kg)     Height 05/03/20 1559 5\' 6"  (1.676 m)     Head Circumference --      Peak Flow --      Pain Score 05/03/20 1559 8     Pain Loc --      Pain Edu? --      Excl. in GC? --      Constitutional: Alert and  oriented. Well appearing and in no acute distress. Eyes: Conjunctivae are normal. PERRL. EOMI. Head: Atraumatic. ENT:      Ears:       Nose: No congestion/rhinnorhea.      Mouth/Throat: Mucous membranes are moist.  Neck: No stridor.    Cardiovascular: Normal rate, regular rhythm. Normal S1 and S2.  Good peripheral circulation. Respiratory: Normal respiratory effort without tachypnea or retractions. Lungs CTAB. Good air entry to the bases with no decreased or absent breath sounds. Musculoskeletal: Full range of motion to all extremities. No gross deformities appreciated.  Visualization of the left thumb reveals mild ecchymosis and edema about the thenar eminence.  No lacerations or abrasions.  Patient has full range of motion to the thumb.  Patient is tender to palpation along the metacarpal bone of the thumb.  No palpable abnormality.  Sensation and capillary refill intact to the digit. Neurologic:  Normal speech and language. No gross focal neurologic deficits are appreciated.  Skin:  Skin is warm, dry and intact. No rash noted. Psychiatric: Mood and affect are normal. Speech and behavior are normal. Patient exhibits appropriate insight and judgement.   ____________________________________________   LABS (all labs ordered are listed, but only abnormal results are displayed)  Labs Reviewed - No data to display ____________________________________________  EKG   ____________________________________________  RADIOLOGY I personally viewed and evaluated these images as part of my medical decision making, as well as reviewing the written report by the radiologist.  DG Hand Complete Left  Result Date: 05/03/2020 CLINICAL DATA:  07/03/2020, left hand and thumb pain EXAM: LEFT HAND - COMPLETE 3+ VIEW COMPARISON:  None. FINDINGS: Frontal, oblique, and lateral views of the left hand demonstrate no fractures. Alignment is anatomic. Joint spaces are well preserved. Soft tissues are normal. IMPRESSION:  1. Unremarkable left hand. Electronically Signed   By: [name] M.D.   On: 05/03/2020 17:30    ____________________________________________    PROCEDURES  Procedure(s) performed:    .Splint Application  Date/Time: 05/03/2020 6:47 PM Performed by: 07/03/2020, PA-C Authorized by: Racheal Patches, PA-C   Consent:    Consent obtained:  Verbal   Consent given by:  Patient   Risks discussed:  Pain and swelling Pre-procedure details:    Sensation:  Normal Procedure details:    Laterality:  Left   Location:  Finger   Finger:  L thumb   Splint type:  Thumb spica   Supplies:  Prefabricated splint Post-procedure details:    Pain:  Improved   Sensation:  Normal   Patient tolerance of procedure:  Tolerated well, no immediate complications      Medications - No data to display   ____________________________________________  INITIAL IMPRESSION / ASSESSMENT AND PLAN / ED COURSE  Pertinent labs & imaging results that were available during my care of the patient were reviewed by me and considered in my medical decision making (see chart for details).  Review of the [place] CSRS was performed in accordance of the [facility] prior to dispensing any controlled drugs.           Patient's diagnosis is consistent with an contusion.  Patient presented to the emergency department complaining of left thenar eminence pain.  Patient had a mechanical fall striking her hand on the floor.  Imaging was reassuring.  Exam was reassuring.  Patient be placed in a thumb spica splint for symptom improvement.  Further treatment with Ultram and anti-inflammatories.  Follow-up primary care as needed. Patient is given ED precautions to return to the ED for any worsening or new symptoms.     ____________________________________________  FINAL CLINICAL IMPRESSION(S) / ED DIAGNOSES  Final diagnoses:  Contusion of left hand, initial encounter      NEW MEDICATIONS STARTED DURING  THIS VISIT:  ED Discharge Orders         Ordered    meloxicam (MOBIC) 15 MG tablet  Daily     05/03/20 1840    traMADol (ULTRAM) 50 MG tablet  Every 6 hours PRN     05/03/20 1840              This chart was dictated using voice recognition software/Dragon. Despite best efforts to proofread, errors can occur which can change the meaning. Any change was purely unintentional.    Racheal Patches, PA-C 05/03/20 Adline Mango., MD 05/04/20 1118

## 2020-05-03 NOTE — ED Triage Notes (Signed)
Pt has left hand pain after tripping over her dog.  Swelling to left hand  Pt alert.  Speech clear.

## 2020-05-03 NOTE — ED Triage Notes (Signed)
First Nurse Note:  [name] today forward hitting hand on hard laminate flooring.  C/O left hand and wrist pain.

## 2020-05-03 NOTE — ED Notes (Signed)
Pt to the er for pain and swelling to the palmar side of the left hand following a fall. Pt fell and her hand hit the stone floor. Pt has gross swelling to the palmar side just proximal to the thumb. Pt has limited ROM in the thumb.

## 2021-04-30 ENCOUNTER — Emergency Department: Payer: Self-pay

## 2021-12-10 ENCOUNTER — Inpatient Hospital Stay: Payer: Self-pay
